# Patient Record
Sex: Male | Born: 1935 | Race: White | Hispanic: No | Marital: Married | Smoking: Former smoker
Health system: Southern US, Community
[De-identification: ages and names within clinical notes are randomized; demographics above are authoritative.]

## PROBLEM LIST (undated history)

## (undated) DIAGNOSIS — I2510 Atherosclerotic heart disease of native coronary artery without angina pectoris: Secondary | ICD-10-CM

## (undated) DIAGNOSIS — E785 Hyperlipidemia, unspecified: Secondary | ICD-10-CM

## (undated) DIAGNOSIS — Z950 Presence of cardiac pacemaker: Secondary | ICD-10-CM

## (undated) DIAGNOSIS — K219 Gastro-esophageal reflux disease without esophagitis: Secondary | ICD-10-CM

## (undated) DIAGNOSIS — Z87442 Personal history of urinary calculi: Secondary | ICD-10-CM

## (undated) DIAGNOSIS — I10 Essential (primary) hypertension: Secondary | ICD-10-CM

## (undated) DIAGNOSIS — M1990 Unspecified osteoarthritis, unspecified site: Secondary | ICD-10-CM

## (undated) DIAGNOSIS — I729 Aneurysm of unspecified site: Secondary | ICD-10-CM

## (undated) HISTORY — PX: CARDIAC CATHETERIZATION: SHX172

## (undated) HISTORY — PX: CORONARY ANGIOPLASTY: SHX604

## (undated) HISTORY — PX: TONSILLECTOMY: SUR1361

## (undated) HISTORY — PX: CARDIAC SURGERY: SHX584

## (undated) HISTORY — PX: CORONARY ARTERY BYPASS GRAFT: SHX141

## (undated) HISTORY — PX: APPENDECTOMY: SHX54

## (undated) HISTORY — PX: CHOLECYSTECTOMY: SHX55

## (undated) HISTORY — PX: MASTOIDECTOMY: SHX711

---

## 1993-12-07 HISTORY — PX: CORONARY ARTERY BYPASS GRAFT: SHX141

## 2010-08-20 ENCOUNTER — Inpatient Hospital Stay: Payer: Self-pay | Admitting: Internal Medicine

## 2011-08-17 DIAGNOSIS — H3411 Central retinal artery occlusion, right eye: Secondary | ICD-10-CM | POA: Insufficient documentation

## 2011-08-17 DIAGNOSIS — I2510 Atherosclerotic heart disease of native coronary artery without angina pectoris: Secondary | ICD-10-CM | POA: Insufficient documentation

## 2011-08-17 DIAGNOSIS — G4700 Insomnia, unspecified: Secondary | ICD-10-CM | POA: Insufficient documentation

## 2011-08-17 DIAGNOSIS — Z8679 Personal history of other diseases of the circulatory system: Secondary | ICD-10-CM | POA: Insufficient documentation

## 2011-08-17 DIAGNOSIS — E782 Mixed hyperlipidemia: Secondary | ICD-10-CM | POA: Insufficient documentation

## 2011-08-17 DIAGNOSIS — N200 Calculus of kidney: Secondary | ICD-10-CM | POA: Insufficient documentation

## 2011-08-17 DIAGNOSIS — I10 Essential (primary) hypertension: Secondary | ICD-10-CM | POA: Insufficient documentation

## 2012-03-26 ENCOUNTER — Emergency Department: Payer: Self-pay | Admitting: Emergency Medicine

## 2012-03-26 LAB — COMPREHENSIVE METABOLIC PANEL
Albumin: 3.9 g/dL (ref 3.4–5.0)
Alkaline Phosphatase: 102 U/L (ref 50–136)
BUN: 29 mg/dL — ABNORMAL HIGH (ref 7–18)
Bilirubin,Total: 0.5 mg/dL (ref 0.2–1.0)
Calcium, Total: 9 mg/dL (ref 8.5–10.1)
Co2: 23 mmol/L (ref 21–32)
EGFR (Non-African Amer.): 52 — ABNORMAL LOW
Potassium: 3.3 mmol/L — ABNORMAL LOW (ref 3.5–5.1)
SGOT(AST): 32 U/L (ref 15–37)
Sodium: 141 mmol/L (ref 136–145)
Total Protein: 7.2 g/dL (ref 6.4–8.2)

## 2012-03-26 LAB — CBC
HCT: 43.4 % (ref 40.0–52.0)
HGB: 14.5 g/dL (ref 13.0–18.0)
MCH: 30.6 pg (ref 26.0–34.0)
RBC: 4.75 10*6/uL (ref 4.40–5.90)

## 2012-03-26 LAB — LIPASE, BLOOD: Lipase: 120 U/L (ref 73–393)

## 2012-03-26 LAB — TROPONIN I: Troponin-I: <0.02 ng/mL

## 2012-03-28 ENCOUNTER — Emergency Department: Payer: Self-pay | Admitting: Emergency Medicine

## 2012-03-29 LAB — URINALYSIS, COMPLETE
Bilirubin,UR: NEGATIVE
Blood: NEGATIVE
Glucose,UR: NEGATIVE mg/dL (ref 0–75)
Nitrite: NEGATIVE
Ph: 5 (ref 4.5–8.0)
Squamous Epithelial: <1
WBC UR: 1 /HPF (ref 0–5)

## 2012-03-29 LAB — BASIC METABOLIC PANEL
Anion Gap: 9 (ref 7–16)
Calcium, Total: 9 mg/dL (ref 8.5–10.1)
Chloride: 107 mmol/L (ref 98–107)
Co2: 26 mmol/L (ref 21–32)
EGFR (African American): 45 — ABNORMAL LOW
Potassium: 4.2 mmol/L (ref 3.5–5.1)
Sodium: 142 mmol/L (ref 136–145)

## 2012-04-04 ENCOUNTER — Ambulatory Visit: Payer: Self-pay | Admitting: Vascular Surgery

## 2012-04-04 LAB — BASIC METABOLIC PANEL
Anion Gap: 6 — ABNORMAL LOW (ref 7–16)
BUN: 13 mg/dL (ref 7–18)
Calcium, Total: 8.8 mg/dL (ref 8.5–10.1)
Chloride: 107 mmol/L (ref 98–107)
EGFR (African American): >60
EGFR (Non-African Amer.): >60
Glucose: 114 mg/dL — ABNORMAL HIGH (ref 65–99)
Potassium: 4 mmol/L (ref 3.5–5.1)

## 2012-04-08 ENCOUNTER — Ambulatory Visit: Payer: Self-pay | Admitting: Vascular Surgery

## 2012-04-08 LAB — CBC
HCT: 42.3 % (ref 40.0–52.0)
MCH: 31.2 pg (ref 26.0–34.0)
MCHC: 33.7 g/dL (ref 32.0–36.0)
MCV: 92 fL (ref 80–100)
Platelet: 300 10*3/uL (ref 150–440)

## 2012-04-15 ENCOUNTER — Inpatient Hospital Stay: Payer: Self-pay | Admitting: Vascular Surgery

## 2012-04-16 LAB — PROTIME-INR: INR: 1

## 2012-04-16 LAB — CBC WITH DIFFERENTIAL/PLATELET
Basophil %: 0.7 %
Eosinophil %: 4.3 %
MCH: 31.1 pg (ref 26.0–34.0)
Monocyte #: 0.7 x10 3/mm (ref 0.2–1.0)
Neutrophil %: 75 %
WBC: 7.4 10*3/uL (ref 3.8–10.6)

## 2012-04-16 LAB — COMPREHENSIVE METABOLIC PANEL
Albumin: 3.1 g/dL — ABNORMAL LOW (ref 3.4–5.0)
Alkaline Phosphatase: 130 U/L (ref 50–136)
Anion Gap: 6 — ABNORMAL LOW (ref 7–16)
Bilirubin,Total: 0.8 mg/dL (ref 0.2–1.0)
Calcium, Total: 8.3 mg/dL — ABNORMAL LOW (ref 8.5–10.1)
Chloride: 103 mmol/L (ref 98–107)
Co2: 31 mmol/L (ref 21–32)
EGFR (African American): >60
Glucose: 127 mg/dL — ABNORMAL HIGH (ref 65–99)
Potassium: 5.2 mmol/L — ABNORMAL HIGH (ref 3.5–5.1)
SGPT (ALT): 347 U/L — ABNORMAL HIGH
Sodium: 140 mmol/L (ref 136–145)

## 2012-04-16 LAB — PHOSPHORUS: Phosphorus: 2.7 mg/dL (ref 2.5–4.9)

## 2012-04-16 LAB — MAGNESIUM: Magnesium: 1.6 mg/dL — ABNORMAL LOW

## 2012-04-16 LAB — APTT: Activated PTT: 35.9 secs (ref 23.6–35.9)

## 2012-08-30 ENCOUNTER — Observation Stay: Payer: Self-pay | Admitting: Internal Medicine

## 2012-08-30 LAB — BASIC METABOLIC PANEL
Anion Gap: 8 (ref 7–16)
BUN: 18 mg/dL (ref 7–18)
Chloride: 109 mmol/L — ABNORMAL HIGH (ref 98–107)
Co2: 26 mmol/L (ref 21–32)
Creatinine: 1.05 mg/dL (ref 0.60–1.30)
EGFR (African American): >60
Glucose: 120 mg/dL — ABNORMAL HIGH (ref 65–99)
Osmolality: 288 (ref 275–301)

## 2012-08-30 LAB — CBC
MCH: 32.1 pg (ref 26.0–34.0)
MCHC: 35.4 g/dL (ref 32.0–36.0)
Platelet: 193 10*3/uL (ref 150–440)
RDW: 13.5 % (ref 11.5–14.5)

## 2012-08-30 LAB — CK TOTAL AND CKMB (NOT AT ARMC): CK-MB: 1 ng/mL (ref 0.5–3.6)

## 2012-08-30 LAB — TROPONIN I: Troponin-I: <0.02 ng/mL

## 2012-08-31 LAB — LIPID PANEL
Cholesterol: 120 mg/dL (ref 0–200)
HDL Cholesterol: 24 mg/dL — ABNORMAL LOW (ref 40–60)
Ldl Cholesterol, Calc: 65 mg/dL (ref 0–100)
VLDL Cholesterol, Calc: 31 mg/dL (ref 5–40)

## 2012-08-31 LAB — CBC WITH DIFFERENTIAL/PLATELET
Basophil #: 0.1 10*3/uL (ref 0.0–0.1)
Basophil %: 1 %
Eosinophil #: 0.2 10*3/uL (ref 0.0–0.7)
HCT: 37.4 % — ABNORMAL LOW (ref 40.0–52.0)
Lymphocyte #: 2 10*3/uL (ref 1.0–3.6)
Lymphocyte %: 28.9 %
MCH: 32.8 pg (ref 26.0–34.0)
MCHC: 36.3 g/dL — ABNORMAL HIGH (ref 32.0–36.0)
Monocyte #: 0.6 x10 3/mm (ref 0.2–1.0)
Neutrophil #: 4.1 10*3/uL (ref 1.4–6.5)
RBC: 4.14 10*6/uL — ABNORMAL LOW (ref 4.40–5.90)
RDW: 13.8 % (ref 11.5–14.5)
WBC: 7 10*3/uL (ref 3.8–10.6)

## 2012-08-31 LAB — BASIC METABOLIC PANEL
Anion Gap: 10 (ref 7–16)
BUN: 18 mg/dL (ref 7–18)
Calcium, Total: 8.3 mg/dL — ABNORMAL LOW (ref 8.5–10.1)
Chloride: 110 mmol/L — ABNORMAL HIGH (ref 98–107)
Co2: 25 mmol/L (ref 21–32)
Creatinine: 1.02 mg/dL (ref 0.60–1.30)
EGFR (African American): >60

## 2012-08-31 LAB — TROPONIN I
Troponin-I: <0.02 ng/mL
Troponin-I: <0.02 ng/mL

## 2014-04-24 DIAGNOSIS — M5136 Other intervertebral disc degeneration, lumbar region: Secondary | ICD-10-CM | POA: Insufficient documentation

## 2014-04-24 DIAGNOSIS — M51369 Other intervertebral disc degeneration, lumbar region without mention of lumbar back pain or lower extremity pain: Secondary | ICD-10-CM | POA: Insufficient documentation

## 2015-03-07 DIAGNOSIS — I071 Rheumatic tricuspid insufficiency: Secondary | ICD-10-CM | POA: Insufficient documentation

## 2015-03-26 NOTE — H&P (Signed)
PATIENT NAME:  [name], [name] MR#:  [id number] DATE OF BIRTH:  November 12, 1933  DATE OF ADMISSION:  08/30/2012  PRIMARY CARE PHYSICIAN:  Dr. Kym Groom CARDIOLOGIST:  Dr. Nehemiah Massed  CHIEF COMPLAINT:  Chest pain.  HISTORY OF PRESENT ILLNESS: This is a 81 year old man with history of coronary artery disease, peripheral vascular disease, hypertension, hyperlipidemia, gastroesophageal reflux disease, and depression. He presents with chest pain. Yesterday while [place] he developed chest pain across the left side of his chest that developed into arm pain. He did have some sweating at the time. He was outside. He went to sit down in the air conditioning. The pain went away after 10 minutes. No associated nausea. No shortness of breath. Nothing made it better or worse except for time. Pain was not that bad, 5/10 in intensity. Today he also developed chest pain at rest while he was watching TV into his left shoulder. He took a nitroglycerin. The pain stopped for about five minutes and then came back. Then he took another nitroglycerin. The pain went away when he came here to the Emergency Room after 20 minutes. The pain today was described as a sharp pain across his chest and then a steady pain after that, as severe as 8 to 9 out of 10 in intensity, again up into the left shoulder. No shortness of breath. No nausea. No diaphoresis. Now he is chest pain free. He had a stress test that was back in 2012.   PAST MEDICAL HISTORY:  1. Coronary disease.  2. Peripheral vascular disease.  3. Hypertension.  4. Hyperlipidemia.  5. Gastroesophageal reflux disease.  6. Depression.   PAST SURGICAL HISTORY:  1. Coronary artery bypass grafting. 2. Cholecystectomy.  3. Appendectomy.  4. Aneurysm in the abdomen.   ALLERGIES: Penicillin.   MEDICATIONS:  1. Lotrel 5/40,1 tablet daily.  2. Aspirin 81 mg daily.  3. Celexa 20 mg daily.  4. Gabapentin 100 mg twice a day.  5. Prevacid 30 mg daily.   6. Lescol 20 mg at bedtime.  7. Metoprolol ER 25 mg in the morning.   SOCIAL HISTORY: Quit smoking at age 27. Does drink a glass or two of wine per week. No drug use.  He was a  businessman in the past, worked with Sales executive.   FAMILY HISTORY: Mother died of complications after a hip fracture. Father died at 35 of a myocardial infarction.   REVIEW OF SYSTEMS: No fever or chills.  Positive for sweating with chest pain yesterday. Positive for weight gain, no fatigue. EYES: He lost vision in his right eye. EARS, NOSE, MOUTH, AND THROAT: No hearing loss. No sore throat. No difficulty swallowing. CARDIOVASCULAR: Positive for chest pain. No palpitations. RESPIRATORY: No shortness of breath. Positive for coughing for one month. Slight yellow phlegm. No cough no hemoptysis.  GASTROINTESTINAL: No nausea. No vomiting. No abdominal pain. No diarrhea. No constipation. No bright red blood per rectum. No melena. GENITOURINARY: No burning on urination or hematuria. MUSCULOSKELETAL: Positive for right hip pain. INTEGUMENT: No rashes or eruptions. NEUROLOGIC: No fainting or blackouts. PSYCHIATRIC: On medication for depression. ENDOCRINE: No thyroid problems. HEMATOLOGIC/LYMPHATIC: No anemia.   PHYSICAL EXAMINATION:  VITAL SIGNS: Temperature 97,  pulse 67, respirations 18, blood pressure 133/93, pulse oximetry 98% on room air.   GENERAL: No respiratory distress.   EYES: Conjunctivae and lids normal. Pupils equal, round, and reactive to light. Extraocular muscles intact. No nystagmus.  EARS, NOSE, MOUTH, AND THROAT: Tympanic membranes no erythema.  Nasal mucosa no erythema. Throat no erythema. No exudate seen. Lips and gums no lesions.   NECK: No JVD. No bruits. No lymphadenopathy. No thyromegaly. No thyroid nodules palpated.   LUNGS: Lungs are clear to auscultation. Decreased breath sounds bilaterally. No rhonchi, rales, or wheeze heard.   CARDIOVASCULAR: S1, S2 normal. No gallops, rubs, or murmurs heard.  Carotid upstroke 2+ bilaterally. No bruits.   EXTREMITIES: Dorsalis pedis pulses 1+ bilaterally. Trace edema to lower extremities. Positive chest wall pain to palpation of the left lower chest.  ABDOMEN: Soft, nontender. No organomegaly/splenomegaly. Normoactive bowel sounds. No masses felt.   LYMPHATIC: No lymph nodes in the neck.   MUSCULOSKELETAL: No clubbing, edema, or cyanosis. Left shoulder- Some pain to palpation behind the  posterior left shoulder.    INTEGUMENT: No rashes or eruptions.   NEUROLOGIC: Cranial nerves II through XII grossly intact. Deep tendon reflexes 2+ bilateral lower extremities.   PSYCHIATRIC: The patient is oriented to person, place, and time.   LABORATORY AND RADIOLOGICAL DATA: Chest x-ray showed no acute cardiopulmonary disease. White blood cell count 6.8, hemoglobin and hematocrit 14.5 and 40.9, platelet count 193, glucose 120, BUN 18, creatinine 1.05, sodium 143, potassium 3.8, chloride 109, CO2 26, calcium 9. Troponin negative. EKG showed normal sinus rhythm, 65 beats per minute, PR  interval slightly short at 84 ms.   ASSESSMENT AND PLAN:  1. Chest pain with history of coronary artery disease. Could also be musculoskeletal pain. With the patient's history of coronary artery disease I will admit as an observation. Get serial cardiac enzymes and put on telemetry. We will get a stress test if enzymes are negative. Continue aspirin and metoprolol. We will give Lovenox 1 mg/kg subcutaneous q.12 hours.  2. Peripheral vascular disease status post aneurysm repair. Continue aspirin.  3. Hypertension. Blood pressure controlled. Continue Lotrel and metoprolol.  4. Hyperlipidemia. Check a lipid profile in the a.m. Continue Lescol.  5. Gastroesophageal reflux disease. We do not have Prevacid here. We will give Protonix.  6. Depression. Continue Celexa. 7. CODE STATUS: The patient is a DO NOT RESUSCITATE.  TIME SPENT ON ADMISSION: 55 minutes.     ____________________________ Tana Conch. Leslye Peer, MD rjw:bjt D: 08/30/2012 20:53:25 ET T: 08/31/2012 07:46:51 ET JOB#: [id number]  cc: Tana Conch. Leslye Peer, MD, <Dictator> Corey Skains, MD Valera Castle, MD Marisue Brooklyn MD ELECTRONICALLY SIGNED 09/06/2012 21:22

## 2015-03-26 NOTE — Consult Note (Signed)
PATIENT NAME:  [name], [name] MR#:  [id number] DATE OF BIRTH:  August 29, 1933  DATE OF CONSULTATION:  08/31/2012  REFERRING PHYSICIAN:   CONSULTING PHYSICIAN:  Corey Skains, MD  REASON FOR CONSULTATION: Acute unstable angina with coronary disease and hypertension.   CHIEF COMPLAINT: "I have chest pain".  HISTORY OF PRESENT ILLNESS: This is a 81 year old male with known coronary artery disease status post previous coronary artery bypass graft, hypertension, and hyperlipidemia having new onset of diaphoresis, mild chest pain, shortness of breath without current evidence of myocardial infarction with an EKG showing normal sinus rhythm, normal EKG. The patient has had normal troponin, CK-MB. His symptoms are consistent with true angina. He has had hypertension and coronary artery disease in the past on appropriate medication management.   Remainder of review of systems negative for vision change, ringing in the ears, hearing loss, cough, congestion, heartburn, nausea, vomiting, diarrhea, bloody stools, stomach pain, extremity pain, leg weakness, cramping of the buttocks, known blood clots, headaches, blackouts, dizzy spells, nosebleed, congestion, trouble swallowing, frequent urination, urination at night, muscle weakness, numbness, anxiety, depression, skin lesions, skin rashes.   PAST MEDICAL HISTORY:  1. Coronary artery disease.  2. Hypertension.  3. Hyperlipidemia.    FAMILY HISTORY: No family history of cardiovascular disease.   SOCIAL HISTORY: Currently denies alcohol or tobacco use.   ALLERGIES: He has no known drug allergies.   CURRENT MEDICATIONS: As listed.    PHYSICAL EXAMINATION:   VITAL SIGNS: Blood pressure 136/68 bilaterally, heart rate 72 upright, reclining, and regular.   GENERAL: He is a well appearing male in no acute distress.   HEENT: No icterus, thyromegaly, ulcers, hemorrhage, or xanthelasma.   CARDIOVASCULAR: Regular rate and rhythm. Normal S1 and S2 without  murmur, gallop, or rub. PMI is normal size and placement. Carotid upstroke normal without bruit. Jugular pressure is normal.   LUNGS: Lungs are clear to auscultation with normal respirations.   ABDOMEN: Soft, nontender without hepatosplenomegaly or masses. Abdominal aorta is normal size without bruit.   EXTREMITIES: 2+ bilateral pulses in dorsal, pedal, radial, and femoral arteries without lower extremity edema, cyanosis, clubbing, or ulcers.   NEUROLOGIC: He is oriented to time, place, and person with normal mood and affect.   ASSESSMENT: This is a 81 year old male with known coronary artery disease status post coronary artery bypass graft with unstable angina needing further treatment options.  RECOMMENDATIONS: Proceed to cardiac catheterization to assess coronary anatomy and further treatment thereof as necessary. The patient understands the risks and benefits of cardiac catheterization. These include the possibility of death, stroke, heart attack, infection, bleeding, or blood clot. He is at low risk for conscious sedation.   ____________________________ Corey Skains, MD bjk:drc D: 09/02/2012 18:07:04 ET T: 09/03/2012 09:51:58 ET JOB#: [id number]  cc: Corey Skains, MD, <Dictator> Corey Skains MD ELECTRONICALLY SIGNED 09/07/2012 8:46

## 2015-03-26 NOTE — Discharge Summary (Signed)
PATIENT NAME:  [name], [name] MR#:  [id number] DATE OF BIRTH:  [birth year]  DATE OF ADMISSION:  08/30/2012 DATE OF DISCHARGE:  09/01/2012  DISCHARGE DIAGNOSES:  1. Chest pain secondary to unstable angina.  2. History of abdominal aortic aneurysm repair.  3. Depression.   PRIMARY CARE PHYSICIAN: Dr. Kym Groom   DISCHARGE MEDICATIONS: 1. Lotrel 5/40, 1 tablet daily.  2. Aspirin 81 mg daily.  3. Plavix 75 mg daily. 4. Celexa 20 mg daily. 5. Gabapentin 100 mg p.o. b.i.d.  6. Prevacid 30 mg daily. 7. Lescol 20 mg at bedtime.  8. Metoprolol ER 25 mg in the morning. 9. Imdur 30 mg daily.   NEW MEDICATIONS: Imdur and Plavix.   HOSPITAL COURSE: 81 year old male with history of coronary artery disease status post CABG and also three stents, also history of peripheral vascular disease, hypertension, hyperlipidemia, gastroesophageal reflux disease, depression came in because of chest pain which is relieved with nitroglycerin. Patient's chest pain was radiating to left shoulder. Patient is admitted for chest pain evaluation and telemetry. Troponins have been negative. EKG showed normal sinus, no ST-T changes. Patient started on aspirin, beta blockers and Lovenox full dose. Cardiology consult obtained with Dr. Nehemiah Massed. Patient was taken to cardiac catheterization yesterday and the cardiac catheterization showedrmal lv function patent stent of rca and lcx arteries patent lima to lad graft significant stenosis of graft to diagonal with complicated lesion high risk for pci and perfusion to very small artery   and the high risk of complication with closure with further intervention that is why patient did not have any intervention and advised medical management. Patient seen by Dr. Nehemiah Massed and he recommended to start Plavix and Imdur. He saw the patient this morning and I have seen the patient also. Patient denied any chest pain and was ambulating around the nurses station. Patient's cardiac catheterization did  have some oozing last night but it stopped now. Patient will be discharged home and follow up with Dr. Nehemiah Massed again. Patient's other medication for hyperlipidemia, gastroesophageal reflux disease and depression are continued with no changes.   LABORATORY, DIAGNOSTIC AND RADIOLOGICAL DATA: Patient's lab data showed creatinine 1.02, BUN 18, hemoglobin 13.6, hematocrit 37.4 yesterday. Troponins have been negative. Chest x-ray is clear.   PHYSICAL EXAMINATION: Blood pressure this morning 110/63, pulse 68, respirations 18, temperature 97.7, sats 98% on room air.   DISPOSITION: He will be discharged home and he will follow with Dr. Nehemiah Massed as scheduled.   ____________________________ Epifanio Lesches, MD sk:cms D: 09/01/2012 09:09:51 ET T: 09/01/2012 14:34:32 ET JOB#: [id number]  cc: Epifanio Lesches, MD, <Dictator> Valera Castle, MD  Epifanio Lesches MD ELECTRONICALLY SIGNED 09/11/2012 14:59

## 2015-03-29 DIAGNOSIS — M10071 Idiopathic gout, right ankle and foot: Secondary | ICD-10-CM | POA: Insufficient documentation

## 2015-03-31 NOTE — Op Note (Signed)
PATIENT NAME:  [name], [name] MR#:  [id number] DATE OF BIRTH:  [birth year]  DATE OF PROCEDURE:  04/04/2012  PREOPERATIVE DIAGNOSES:  1. Abdominal aortic aneurysm and iliac artery aneurysm.  2. Coronary disease.  3. Hypertension.   POSTOPERATIVE DIAGNOSES:  1. Abdominal aortic aneurysm and iliac artery aneurysm. 2. Coronary disease. 3. Hypertension.  PROCEDURES: 1. Catheter placement into second order right hypogastric artery branch from left femoral approach.  2. Aortogram and iliofemoral arteriogram.  3. Coil embolization of right hypogastric artery with three 8 x 14 coils and one 10 x 14 coil.  4. StarClose closure device, left femoral artery.   SURGEON: Algernon Huxley, MD   ANESTHESIA: Local with moderate conscious sedation.   ESTIMATED BLOOD LOSS: Minimal.   FLUOROSCOPY TIME: 4.3 minutes.   CONTRAST USED: 65 mL.   INDICATION FOR PROCEDURE: This is a gentleman who was referred to me for enlargement of his abdominal aortic aneurysm. This had been previously measured in the 4.6 to 4.8 range, however, on a recent uninfused CT scan performed due to mild chronic kidney disease and the presence of nephrolithiasis this was found to have increased to 5.2 cm in maximal diameter. He had previously had an infused scan which showed him to have the above-mentioned abdominal aortic aneurysm as well as a right common iliac artery aneurysm distally and the common iliac artery aneurysm was seen on the uninfused scan as well. The patient is brought in today for angiography for further delineation evaluation of his aneurysm and determine if he is a stent graft candidate as well as treatment of the right hypogastric artery with coil embolization if he is a stent graft candidate preliminary to that to be able to treat this in an endovascular fashion. Risks and benefits were discussed. Informed consent was obtained.   DESCRIPTION OF PROCEDURE: The patient is brought to the Vascular Interventional Radiology  Suite. Groins were shaved and prepped and a sterile surgical field was created. The left femoral head was localized with fluoroscopy and the left femoral artery was accessed without difficulty with a Seldinger needle. A 3-J wire and 5 French sheath were placed. Pigtail catheter was placed in the aorta at the L1 level and then pulled down to the aortic bifurcation for pelvic obliques. This demonstrated an infrarenal abdominal aortic aneurysm. There did appear to be adequate length of normal artery prior to the aneurysm starting which was noted on previous CT scans. The right common iliac artery aneurysm had a reasonably normal size flow lumen and this was mostly mural thrombus which again was seen on his infused CT scan from two years ago. The right hypogastric artery had a mild stenosis at its origin. The left hypogastric artery was patent. There was also a mild to moderate stenosis in the proximal right common iliac artery. The patient was given 2500 units of intravenous heparin for systemic anticoagulation. A rim catheter and a stiff-angled Glidewire were used to cannulate the right common iliac artery and selective angiogram was performed in this location with a steep LAO projection to splay open the origin of the hypogastric artery. Following this, I was able to easily cannulate the right hypogastric artery and get out into a second order branch of the hypogastric artery confirming intraluminal flow with contrast. I started coil embolization from this point back into the main hypogastric artery. A total of three 8 mm x 14 cm length Nester coils were placed and then a fourth coil which was 10 mm in diameter and 14  cm in length was placed back into the main hypogastric artery. The catheter were was pulled back to the origin of the hypogastric artery. Imaging was performed which showed good location of these coils with patency of the common and external iliac arteries and at this point I elected to terminate the  procedure. The diagnostic catheter was removed. Oblique arteriogram was performed of the left femoral artery and StarClose closure device was deployed in the usual fashion with excellent hemostatic result. The patient tolerated the procedure well and was taken to the recovery room in stable condition.   ____________________________ Algernon Huxley, MD jsd:drc D: 04/04/2012 10:54:03 ET T: 04/04/2012 11:10:45 ET JOB#: [id number]  cc: Algernon Huxley, MD, <Dictator>, Valera Castle, MD Algernon Huxley MD ELECTRONICALLY SIGNED 04/04/2012 16:38

## 2015-03-31 NOTE — Discharge Summary (Signed)
PATIENT NAME:  [name], EGNOR MR#:  [id number] DATE OF BIRTH:  10/23/1933  DATE OF ADMISSION:  04/15/2012 DATE OF DISCHARGE:  04/16/2012  ADMITTING/DISCHARGE DIAGNOSES:  1. Abdominal aortic aneurysm.  2. Hypertension.  3. Coronary artery disease.   PROCEDURES PERFORMED: Endovascular abdominal aortic aneurysm repair. For full details of that, please see that dictated Operative Summary.   BRIEF HISTORY: The patient is a 81 year old white male who has an abdominal aortic aneurysm with iliac aneurysmal component. He has undergone previous coil embolization of the right hypogastric artery and is brought back for his definitive aneurysm repair.   HOSPITAL COURSE: The patient was admitted to the hospital to Same Day Surgery and taken to the Operating Room where his aneurysm was repaired. He was admitted to the Critical Care Unit overnight for observation where he did well. He was able to advance his diet without difficulty. His pain was well-controlled with oral medications. He was afebrile with vital signs stable. He progressed overnight and his morning labs were all reasonable. He had no renal insufficiency or significant anemia and he was deemed stable for discharge on postoperative day number one.    DISPOSITION: The patient will be discharged home accompanied by family.   DIET AND ACTIVITY: His diet will be regular. His activity will be as tolerated.   FOLLOW-UP: His return appointment will be in 4 to 6 weeks in the office in the aortic duplex.   DISCHARGE MEDICATIONS: He will resume all of his previous home medications and was given Norco as needed for pain. ____________________________ Algernon Huxley, MD jsd:rbg D:  04/16/2012 07:18:02 ET  T:  04/18/2012 12:53:53 ET  JOB#: [id number] Erskine Squibb DEW MD ELECTRONICALLY SIGNED 04/20/2012 15:11

## 2015-03-31 NOTE — Op Note (Signed)
PATIENT NAME:  [name], BURNSIDE MR#:  [id number] DATE OF BIRTH:  05/18/1933  DATE OF PROCEDURE:  04/15/2012  PREOPERATIVE DIAGNOSIS: Abdominal aortic aneurysm.   POSTOPERATIVE DIAGNOSIS: Abdominal aortic aneurysm.  PROCEDURES PERFORMED:  1. Abdominal aortic aneurysm repair with endovascular stent graft, a Gore Excluder.  2. Extension of right iliac limb.  3. Bilateral open femoral artery cutdowns, Dr. Lucky Cowboy on the right and myself on the left.  4. Introduction catheter into aorta, bilateral.   SURGEON: Leotis Pain, MD   CO-SURGEON: Katha Cabal, MD    ANESTHESIA: General by endotracheal intubation.    FLUIDS: Per anesthesia record.   ESTIMATED BLOOD LOSS: Less than 50 mL.   SPECIMEN: None.   INDICATIONS: [name] is a 81 year old gentleman who presented to the office with abdominal aortic aneurysm identified on noncontrasted CT for exclusion of a renal stone. The risks and benefits as well as alternative therapies were discussed in detail. After review of the scan, the patient has subsequently been prepared for endovascular repair. He agrees to proceed.   DESCRIPTION OF PROCEDURE: The patient is taken to Special Procedures, placed in supine position. After adequate general anesthesia is induced and appropriate invasive monitors are placed, he is positioned supine. He is prepped from the nipples to the knees.   A vertical incision is created in both groins simultaneously overlying the femoral impulses, Dr. Lucky Cowboy working on the right and myself on the left. Dissection is carried down to expose the femoral artery, which is looped proximally and distally with Silastic vessel loops, and 6000 units of heparin is given.   Seldinger needles are then used to access the common femoral arteries through these open incisions, and J-wires are advanced into the abdominal aorta. The catheter is then advanced, a pigtail catheter on the left and KMP on the right. Bolus injection of contrast is utilized to  demonstrate the aorta. The renal arteries are localized, as is the bifurcation and the left external iliac and internal iliac bifurcation. The right internal iliac has been coiled preoperatively.   After appropriate measurements are made, the pigtail catheter is used to advance an Amplatz Superstiff Wire up the left. An 18-French dry seal sheath is advanced up over the wire and exchanged for the 8-French sheath that had been inserted. The main body, a 23 x 12 x 14 Gore Excluder device, was then advanced up to the level of the renals and deployed without difficulty.   Working simultaneously, Dr. Lucky Cowboy then cannulated the gate with an angled Glidewire, a KMP catheter advanced up and exchanged for a pigtail catheter which is rotated within the main body confirming intraluminal placement. The pigtail was then positioned just above the graft, and bolus injection of contrast was utilized to demonstrate appropriate positioning of the stent graft below the lower left renal. A 12-French sheath is then exchanged for the 8-French  sheath on the right, and a 12 x 14 contralateral limb is then advanced and deployed without difficulty. Follow-up angiography demonstrated that the limb was only seated approximately 1 cm into the external; and this would be inadequate to maintain an adequate seal, therefore, a 10 x 7 extension was added. A Coda balloon was then used to angioplasty all seal zones as well as the overlap of the contralateral limb. The pigtail catheter was then advanced up the right side. Bolus injection of contrast was used with delayed images. No endoleaks were noted.   Both sheaths were then removed. Wires were removed, and the arteriotomies were  repaired with interrupted 6-0 Prolene. Both wounds were then irrigated and closed in multiple layers using 2-0 Vicryl for the femoral sheath, multiple layers of 3-0 Vicryl, and then 4-0 Monocryl for the skin. Dermabond was applied. The patient tolerated the procedure  well, and there were no immediate complications. Sponge and needle counts were correct. He is taken to the recovery area in excellent condition.  ____________________________ Katha Cabal, MD ggs:cbb D: 04/15/2012 09:54:42 ET T: 04/15/2012 12:11:20 ET JOB#: [id number] cc: Katha Cabal, MD, <Dictator> Katha Cabal MD ELECTRONICALLY SIGNED 04/25/2012 17:40

## 2015-03-31 NOTE — Op Note (Signed)
PATIENT NAME:  [name], [name] MR#:  [id number] DATE OF BIRTH:  Mar 16, 1933  DATE OF PROCEDURE:  04/15/2012  PREOPERATIVE DIAGNOSES:  1. Abdominal aortic aneurysm.  2. Hypertension.  3. History of coronary artery disease.   POSTOPERATIVE DIAGNOSES:  1. Abdominal aortic aneurysm.  2. Hypertension.  3. History of coronary artery disease.   PROCEDURES:    1. Bilateral femoral artery cutdowns for placement of endoprosthesis, right by Dr. Lucky Cowboy, left by Dr. Delana Meyer.  2. Catheter placement into aorta from bilateral femoral approaches, right by Dr. Lucky Cowboy, left by Dr. Delana Meyer.  3. Placement of a Gore Excluder C3 endoprosthesis, main body 23, 14 cm length, primary right, co-surgeons, Dr. Lucky Cowboy and Dr. Delana Meyer.  4. Placement of a contralateral limb 12 mm diameter 14 cm length, co-surgeons, Dr. Lucky Cowboy and Dr. Delana Meyer.  5. Placement of a primary left iliac extender with a 10 mm diameter x 7 cm length. 6. Placement of a right iliac extender 10 mm diameter x 7 cm. length by Dr. Lucky Cowboy.    CO-SURGEONS: Leotis Pain, MD and Hortencia Pilar, MD  ANESTHESIA: General.   ESTIMATED BLOOD LOSS: Approximately 50 mL.  FLUOROSCOPY TIME: 8.9 minutes.  CONTRAST USED: 55 mL.   INDICATION FOR PROCEDURE: This is a 81 year old male with an abdominal aortic aneurysm greater than 5 cm in maximal diameter. He had acceptable anatomy for endoprosthesis placement and he desired endovascular repair. Risks and benefits are discussed. Informed consent was obtained.   DESCRIPTION OF PROCEDURE: The patient was brought to the vascular interventional radiology suite and anesthesia caused for general anesthetic. His groins and abdomen were sterilely prepped and draped and a sterile surgical field was created. We created femoral artery cutdowns bilaterally. I performed the right and Dr. Delana Meyer performed the left and the femoral arteries were dissected out and encircled with vessel loops. The patient was given 6,000 units of intravenous heparin for  systemic anticoagulation. An 8 French sheath was placed bilaterally. Dr. Delana Meyer placed the pigtail catheter up the left hand side, and an AP aortogram was performed. He then placed an 54 French sheath in the main body which was 23 mm diameter proximal, 14 cm length Gore Excluder C3 endoprosthesis. I placed a Kumpe catheter from the right femoral sheath into the aorta. He deployed this and I cannulated the contralateral gate and exchanged for a pigtail catheter and confirmed successful cannulation. Injection performed which showed an excellent location just below the left renal artery which was lowest. He completed the deployment of the main body and used the Coda balloon to tap the seal zones. I performed a retrograde arteriogram and placed a 12 mm diameter x14 centimeter length contralateral limb. This came just to the proximal external iliac artery. 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 length extender was placed another 3 to 4 cm distally into the external iliac artery. The compliant balloon was used to iron out the junction points and seal zones and placed a pigtail catheter through the left femoral sheath into the aorta and performed occlusion aortogram. This showed excellent location of the endoprosthesis just below the renal vessels with the left hypogastric artery being patent. The right hypogastric artery being previously coiled and no significant endoleak seen. At this point, the sheath was removed. The femoral arteriotomies were closed with interrupted Prolene sutures. The groins were then closed with  layered 2-0 Vicryl, 3-0 Vicryl, another 3-0 Vicryl and a 4-0 Monocryl. Dermabond was placed as a dressing. The patient tolerated the procedure well and was taken to the recovery room in stable condition.   ____________________________ Algernon Huxley,  MD jsd:ap D: 04/15/2012 09:52:47 ET T: 04/15/2012 12:06:13 ET JOB#: [id number]  cc: Algernon Huxley, MD, <Dictator> [place], MD Algernon Huxley MD ELECTRONICALLY SIGNED 04/20/2012 15:11

## 2015-08-08 DIAGNOSIS — M461 Sacroiliitis, not elsewhere classified: Secondary | ICD-10-CM | POA: Insufficient documentation

## 2015-10-23 ENCOUNTER — Other Ambulatory Visit: Payer: Self-pay | Admitting: Otolaryngology

## 2015-10-23 DIAGNOSIS — H9201 Otalgia, right ear: Secondary | ICD-10-CM

## 2015-10-23 DIAGNOSIS — R42 Dizziness and giddiness: Secondary | ICD-10-CM

## 2015-10-28 ENCOUNTER — Ambulatory Visit
Admission: RE | Admit: 2015-10-28 | Discharge: 2015-10-28 | Disposition: A | Discharge status: Home / Self Care | Payer: Medicare Other | Source: Ambulatory Visit | Attending: Otolaryngology | Admitting: Otolaryngology

## 2015-10-28 DIAGNOSIS — H9201 Otalgia, right ear: Secondary | ICD-10-CM | POA: Insufficient documentation

## 2015-10-28 DIAGNOSIS — I639 Cerebral infarction, unspecified: Secondary | ICD-10-CM | POA: Insufficient documentation

## 2015-10-28 DIAGNOSIS — R42 Dizziness and giddiness: Secondary | ICD-10-CM | POA: Diagnosis not present

## 2015-10-28 MED ORDER — GADOBENATE DIMEGLUMINE 529 MG/ML IV SOLN
20.0000 mL | Freq: Once | INTRAVENOUS | Status: AC | PRN
Start: 2015-10-28 — End: 2015-10-28
  Administered 2015-10-28: 19 mL via INTRAVENOUS

## 2015-12-10 ENCOUNTER — Encounter: Payer: Self-pay | Admitting: Emergency Medicine

## 2015-12-10 ENCOUNTER — Other Ambulatory Visit: Payer: Self-pay

## 2015-12-10 ENCOUNTER — Emergency Department: Payer: Medicare Other

## 2015-12-10 ENCOUNTER — Emergency Department
Admission: EM | Admit: 2015-12-10 | Discharge: 2015-12-10 | Disposition: A | Discharge status: Home / Self Care | Payer: Medicare Other | Attending: Emergency Medicine | Admitting: Emergency Medicine

## 2015-12-10 DIAGNOSIS — R110 Nausea: Secondary | ICD-10-CM | POA: Insufficient documentation

## 2015-12-10 DIAGNOSIS — R079 Chest pain, unspecified: Secondary | ICD-10-CM | POA: Diagnosis present

## 2015-12-10 DIAGNOSIS — I10 Essential (primary) hypertension: Secondary | ICD-10-CM | POA: Diagnosis not present

## 2015-12-10 DIAGNOSIS — Z880 Allergy status to penicillin: Secondary | ICD-10-CM | POA: Insufficient documentation

## 2015-12-10 DIAGNOSIS — Z87891 Personal history of nicotine dependence: Secondary | ICD-10-CM | POA: Insufficient documentation

## 2015-12-10 DIAGNOSIS — I4891 Unspecified atrial fibrillation: Secondary | ICD-10-CM | POA: Insufficient documentation

## 2015-12-10 DIAGNOSIS — R0789 Other chest pain: Secondary | ICD-10-CM

## 2015-12-10 DIAGNOSIS — R001 Bradycardia, unspecified: Secondary | ICD-10-CM

## 2015-12-10 HISTORY — DX: Essential (primary) hypertension: I10

## 2015-12-10 HISTORY — DX: Aneurysm of unspecified site: I72.9

## 2015-12-10 LAB — BASIC METABOLIC PANEL
Anion gap: 10 (ref 5–15)
BUN: 14 mg/dL (ref 6–20)
CHLORIDE: 104 mmol/L (ref 101–111)
CO2: 25 mmol/L (ref 22–32)
CREATININE: 0.96 mg/dL (ref 0.61–1.24)
Calcium: 9.7 mg/dL (ref 8.9–10.3)
GFR calc Af Amer: >60 mL/min (ref >60)
GFR calc non Af Amer: >60 mL/min (ref >60)
GLUCOSE: 166 mg/dL — AB (ref 65–99)
Potassium: 3.8 mmol/L (ref 3.5–5.1)
SODIUM: 139 mmol/L (ref 135–145)

## 2015-12-10 LAB — HEPATIC FUNCTION PANEL
ALK PHOS: 73 U/L (ref 38–126)
ALT: 20 U/L (ref 17–63)
AST: 23 U/L (ref 15–41)
Albumin: 4.5 g/dL (ref 3.5–5.0)
BILIRUBIN DIRECT: 0.3 mg/dL (ref 0.1–0.5)
BILIRUBIN INDIRECT: 1 mg/dL — AB (ref 0.3–0.9)
BILIRUBIN TOTAL: 1.3 mg/dL — AB (ref 0.3–1.2)
Total Protein: 7.2 g/dL (ref 6.5–8.1)

## 2015-12-10 LAB — CBC
HCT: 46.2 % (ref 40.0–52.0)
Hemoglobin: 16 g/dL (ref 13.0–18.0)
MCH: 32.4 pg (ref 26.0–34.0)
MCHC: 34.6 g/dL (ref 32.0–36.0)
MCV: 93.5 fL (ref 80.0–100.0)
PLATELETS: 168 10*3/uL (ref 150–440)
RBC: 4.94 MIL/uL (ref 4.40–5.90)
RDW: 13.9 % (ref 11.5–14.5)
WBC: 7.2 10*3/uL (ref 3.8–10.6)

## 2015-12-10 LAB — TROPONIN I
Troponin I: <0.03 ng/mL (ref <0.031)
Troponin I: <0.03 ng/mL (ref <0.031)

## 2015-12-10 LAB — PROTIME-INR
INR: 1.14
PROTHROMBIN TIME: 14.8 s (ref 11.4–15.0)

## 2015-12-10 LAB — LIPASE, BLOOD: LIPASE: 19 U/L (ref 11–51)

## 2015-12-10 MED ORDER — HYDROCODONE-ACETAMINOPHEN 5-325 MG PO TABS: 1.0000 | ORAL_TABLET | ORAL | Status: DC | PRN

## 2015-12-10 MED ORDER — ONDANSETRON HCL 4 MG/2ML IJ SOLN
4.0000 mg | Freq: Once | INTRAMUSCULAR | Status: AC
  Administered 2015-12-10: 4 mg via INTRAVENOUS
  Filled 2015-12-10: qty 2

## 2015-12-10 MED ORDER — MORPHINE SULFATE (PF) 4 MG/ML IV SOLN
4.0000 mg | Freq: Once | INTRAVENOUS | Status: AC
  Administered 2015-12-10: 4 mg via INTRAVENOUS
  Filled 2015-12-10: qty 1

## 2015-12-10 NOTE — Discharge Instructions (Signed)
At this time we are very reassured by our findings. You would prefer to go home which is not unreasonable. We have arranged you to see her doctor tomorrow morning, Dr. Nehemiah Massed we'll see what 10:00. If in the meantime your shortness of breath nausea lightheadedness or you feel worse in any way, please return to the emergency Department  Chest Wall Pain Chest wall pain is pain in or around the bones and muscles of your chest. Sometimes, an injury causes this pain. Sometimes, the cause may not be known. This pain may take several weeks or longer to get better. HOME CARE Pay attention to any changes in your symptoms. Take these actions to help with your pain:  Rest as told by your doctor.  Avoid activities that cause pain. Try not to use your chest, belly (abdominal), or side muscles to lift heavy things.  If directed, apply ice to the painful area:  Put ice in a plastic bag.  Place a towel between your skin and the bag.  Leave the ice on for 20 minutes, 2-3 times per day.  Take over-the-counter and prescription medicines only as told by your doctor.  Do not use tobacco products, including cigarettes, chewing tobacco, and e-cigarettes. If you need help quitting, ask your doctor.  Keep all follow-up visits as told by your doctor. This is important. GET HELP IF:  You have a fever.  Your chest pain gets worse.  You have new symptoms. GET HELP RIGHT AWAY IF:  You feel sick to your stomach (nauseous) or you throw up (vomit).  You feel sweaty or light-headed.  You have a cough with phlegm (sputum) or you cough up blood.  You are short of breath.   This information is not intended to replace advice given to you by your health care provider. Make sure you discuss any questions you have with your health care provider.   Document Released: 05/11/2008 Document Revised: 08/14/2015 Document Reviewed: 02/18/2015 Elsevier Interactive Patient Education 2016 [employer].  Chest Pain  Observation It is often hard to give a specific diagnosis for the cause of chest pain. Among other possibilities your symptoms might be caused by inadequate oxygen delivery to your heart (angina). Angina that is not treated or evaluated can lead to a heart attack (myocardial infarction) or death. Blood tests, electrocardiograms, and X-rays may have been done to help determine a possible cause of your chest pain. After evaluation and observation, your health care provider has determined that it is unlikely your pain was caused by an unstable condition that requires hospitalization. However, a full evaluation of your pain may need to be completed, with additional diagnostic testing as directed. It is very important to keep your follow-up appointments. Not keeping your follow-up appointments could result in permanent heart damage, disability, or death. If there is any problem keeping your follow-up appointments, you must call your health care provider. HOME CARE INSTRUCTIONS  Due to the slight chance that your pain could be angina, it is important to follow your health care provider's treatment plan and also maintain a healthy lifestyle:  Maintain or work toward achieving a healthy weight.  Stay physically active and exercise regularly.  Decrease your salt intake.  Eat a balanced, healthy diet. Talk to a dietitian to learn about heart-healthy foods.  Increase your fiber intake by including whole grains, vegetables, fruits, and nuts in your diet.  Avoid situations that cause stress, anger, or depression.  Take medicines as advised by your health care provider. Report  any side effects to your health care provider. Do not stop medicines or adjust the dosages on your own.  Quit smoking. Do not use nicotine patches or gum until you check with your health care provider.  Keep your blood pressure, blood sugar, and cholesterol levels within normal limits.  Limit alcohol intake to no more than 1 drink  per day for women who are not pregnant and 2 drinks per day for men.  Do not abuse drugs. SEEK IMMEDIATE MEDICAL CARE IF: You have severe chest pain or pressure which may include symptoms such as:  You feel pain or pressure in your arms, neck, jaw, or back.  You have severe back or abdominal pain, feel sick to your stomach (nauseous), or throw up (vomit).  You are sweating profusely.  You are having a fast or irregular heartbeat.  You feel short of breath while at rest.  You notice increasing shortness of breath during rest, sleep, or with activity.  You have chest pain that does not get better after rest or after taking your usual medicine.  You wake from sleep with chest pain.  You are unable to sleep because you cannot breathe.  You develop a frequent cough or you are coughing up blood.  You feel dizzy, faint, or experience extreme fatigue.  You develop severe weakness, dizziness, fainting, or chills. Any of these symptoms may represent a serious problem that is an emergency. Do not wait to see if the symptoms will go away. Call your local emergency services (911 in the [place] Do not drive yourself to the hospital. MAKE SURE YOU:  Understand these instructions.  Will watch your condition.  Will get help right away if you are not doing well or get worse.   This information is not intended to replace advice given to you by your health care provider. Make sure you discuss any questions you have with your health care provider.   Document Released: 12/26/2010 Document Revised: 11/28/2013 Document Reviewed: 05/25/2013 Elsevier Interactive Patient Education 2016 [employer]  Atrial Fibrillation Atrial fibrillation is a type of heartbeat that is irregular or fast (rapid). If you have this condition, your heart keeps quivering in a weird (chaotic) way. This condition can make it so your heart cannot pump blood normally. Having this condition gives a person more risk for  stroke, heart failure, and other heart problems. There are different types of atrial fibrillation. Talk with your doctor to learn about the type that you have. HOME CARE  Take over-the-counter and prescription medicines only as told by your doctor.  If your doctor prescribed a blood-thinning medicine, take it exactly as told. Taking too much of it can cause bleeding. If you do not take enough of it, you will not have the protection that you need against stroke and other problems.  Do not use any tobacco products. These include cigarettes, chewing tobacco, and e-cigarettes. If you need help quitting, ask your doctor.  If you have apnea (obstructive sleep apnea), manage it as told by your doctor.  Do not drink alcohol.  Do not drink beverages that have caffeine. These include coffee, soda, and tea.  Maintain a healthy weight. Do not use diet pills unless your doctor says they are safe for you. Diet pills may make heart problems worse.  Follow diet instructions as told by your doctor.  Exercise regularly as told by your doctor.  Keep all follow-up visits as told by your doctor. This is important. GET HELP IF:  You  notice a change in the speed, rhythm, or strength of your heartbeat.  You are taking a blood-thinning medicine and you notice more bruising.  You get tired more easily when you move or exercise. GET HELP RIGHT AWAY IF:  You have pain in your chest or your belly (abdomen).  You have sweating or weakness.  You feel sick to your stomach (nauseous).  You notice blood in your throw up (vomit), poop (stool), or pee (urine).  You are short of breath.  You suddenly have swollen feet and ankles.  You feel dizzy.  Your suddenly get weak or numb in your face, arms, or legs, especially if it happens on one side of your body.  You have trouble talking, trouble understanding, or both.  Your face or your eyelid droops on one side. These symptoms may be an emergency. Do not  wait to see if the symptoms will go away. Get medical help right away. Call your local emergency services (911 in the [place] Do not drive yourself to the hospital.   This information is not intended to replace advice given to you by your health care provider. Make sure you discuss any questions you have with your health care provider.   Document Released: 09/01/2008 Document Revised: 08/14/2015 Document Reviewed: 03/20/2015 Elsevier Interactive Patient Education 2016 [employer]  Bradycardia Bradycardia is a slower-than-normal heart rate. A normal resting heart rate for an adult ranges from 60 to 100 beats per minute. With bradycardia, the resting heart rate is less than 60 beats per minute. Bradycardia is a problem if your heart cannot pump enough oxygen-rich blood through your body. Bradycardia is not a problem for everyone. For some healthy adults, a slow resting heart rate is normal.  CAUSES  Bradycardia may be caused by:  A problem with the heart's electrical system, such as heart block.  A problem with the heart's natural pacemaker (sinus node).  Heart disease, damage, or infection.  Certain medicines that treat heart conditions.  Certain conditions, such as hypothyroidism and obstructive sleep apnea. RISK FACTORS  Risk factors include:  Being 25 or older.  Having high blood pressure (hypertension), high cholesterol (hyperlipidemia), or diabetes.  Drinking heavily, using tobacco products, or using drugs.  Being stressed. SIGNS AND SYMPTOMS  Signs and symptoms include:  Light-headedness.  Faintingor near fainting.  Fatigue and weakness.  Shortness of breath.  Chest pain (angina).  Drowsiness.  Confusion.  Dizziness. DIAGNOSIS  Diagnosis of bradycardia may include:  A physical exam.  An electrocardiogram (ECG).  Blood tests. TREATMENT  Treatment for bradycardia may include:  Treatment of an underlying condition.  Pacemaker placement. A pacemaker  is a small, battery-powered device that is placed under the skin and is programmed to sense your heartbeats. If your heart rate is lower than the programmed rate, the pacemaker will pace your heart.  Changing your medicines or dosages. HOME CARE INSTRUCTIONS  Take medicines only as directed by your health care provider.  Manage any health conditions that contribute to bradycardia as directed by your health care provider.  Follow a heart-healthy diet. A dietitian can help educate you on healthy food options and changes.  Follow an exercise program approved by your health care provider.  Maintain a healthy weight. Lose weight as approved by your health care provider.  Do not use tobacco products, including cigarettes, chewing tobacco, or electronic cigarettes. If you need help quitting, ask your health care provider.  Do not use illegal drugs.  Limit alcohol intake to no  more than 1 drink per day for nonpregnant women and 2 drinks per day for men. One drink equals 12 ounces of beer, 5 ounces of wine, or 1 ounces of hard liquor.  Keep all follow-up visits as directed by your health care provider. This is important. SEEK MEDICAL CARE IF:  You feel light-headed or dizzy.  You almost faint.  You feel weak or are easily fatigued during physical activity.  You experience confusion or have memory problems. SEEK IMMEDIATE MEDICAL CARE IF:   You faint.  You have an irregular heartbeat.  You have chest pain.  You have trouble breathing. MAKE SURE YOU:   Understand these instructions.  Will watch your condition.  Will get help right away if you are not doing well or get worse.   This information is not intended to replace advice given to you by your health care provider. Make sure you discuss any questions you have with your health care provider.   Document Released: 08/15/2002 Document Revised: 12/14/2014 Document Reviewed: 02/28/2014 Elsevier Interactive Patient Education  [employer].

## 2015-12-10 NOTE — ED Notes (Signed)
Pt presents with left sided chest pain stabbing in nature started at 630 this am and has been constant, pt tried tums with no relief and then tried two nitro tabs with some relief but then came back. Pt states pain radiates down into left arm and left shoulder. Pt with hx of cardiac stents.

## 2015-12-10 NOTE — ED Notes (Signed)
Left sided chest pressure that began this am, radiates to left arm, nausea with dry heaving in triage. Hx of 4 stents and bypass surgery. Pt was resting in bed when pain began.

## 2015-12-10 NOTE — ED Provider Notes (Addendum)
[facility] Emergency Department Provider Note  ____________________________________________   I have reviewed the triage vital signs and the nursing notes.   HISTORY  Chief Complaint Chest Pain and Nausea    HPI [name] is a 82 y.o. male With extensive history including a lower abdominal aortic aneurysm status post remote repair, as well as CAD hypertension presents today complaining of left-sided chest wall pain. He states he fell a few days ago did not hit his head had no symptoms until this morning when he woke up and his ribs were hurting on the left. Hurts when he touches it or when he changes position. He states the pain made him nauseated but he had no shortness of breath and no radiation of the pain no abdominal pain and difficulty eating or drinking no headache no stiff neck no head injury from the fall he states. The fall he states was about a week ago. He states that he has had no leg swelling or abdominal pain. Did not vomit. He states as long as he lies still he has minimaldiscomfort but when he tries to move around in the bed pain increases.does state he has a history of anticoagulation from atrial fibrillation. Denies shortness of breath.  Past Medical History  Diagnosis Date  . Aneurysm ([place])   . Hypertension     There are no active problems to display for this patient.   Past Surgical History  Procedure Laterality Date  . Cardiac surgery      No current outpatient prescriptions on file.  Allergies Penicillins  No family history on file.  Social History Social History  Substance Use Topics  . Smoking status: Former Smoker    Types: Cigarettes  . Smokeless tobacco: None  . Alcohol Use: Yes     Comment: wine occas.     Review of Systems Constitutional: No fever/chills Eyes: No visual changes. ENT: No sore throat. No stiff neck no neck pain Cardiovascular: positivechest pain. Respiratory: Denies shortness of  breath. Gastrointestinal:   no vomiting.  No diarrhea.  No constipation. Genitourinary: Negative for dysuria. Musculoskeletal: Negative lower extremity swelling Skin: Negative for rash. Neurological: Negative for headaches, focal weakness or numbness. 10-point ROS otherwise negative.  ____________________________________________   PHYSICAL EXAM:  VITAL SIGNS: ED Triage Vitals  Enc Vitals Group     BP 12/10/15 1011 169/75 mmHg     Pulse Rate 12/10/15 1011 54     Resp 12/10/15 1011 18     Temp 12/10/15 1011 97.5 F (36.4 C)     Temp Source 12/10/15 1011 Oral     SpO2 12/10/15 1011 99 %     Weight 12/10/15 1011 202 lb (91.627 kg)     Height 12/10/15 1011 6\' 1"  (1.854 m)     Head Cir --      Peak Flow --      Pain Score 12/10/15 1008 6     Pain Loc --      Pain Edu? --      Excl. in [place]? --     Constitutional: Alert and oriented. Well appearing and in no acute distress. Eyes: Conjunctivae are normal. PERRL. EOMI. Head: Atraumatic. Nose: No congestion/rhinnorhea. Mouth/Throat: Mucous membranes are moist.  Oropharynx non-erythematous. Neck: No stridor.   Nontender with no meningismus Cardiovascular: irregularly irregular. Grossly normal heart sounds.  Good peripheral circulation. Respiratory: Normal respiratory effort.  No retractions. Lungs CTAB. Chest: Tender to palpation left chest wall lateral to the areola mitosis specific spot  that he indicated he states "ouch that's the pain right there" and pulls back. There is no flail chest and there is no crepitus but this definitely reproduces his pain. Abdominal: Soft and nontender. No distention. No guarding no rebound Back:  There is no focal tenderness or step off there is no midline tenderness there are no lesions noted. there is no CVA tenderness Musculoskeletal: No lower extremity tenderness. No joint effusions, no DVT signs strong distal pulses no edema Neurologic:  Normal speech and language. No gross focal neurologic  deficits are appreciated.  Skin:  Skin is warm, dry and intact. No rash noted. Psychiatric: Mood and affect are normal. Speech and behavior are normal.  ____________________________________________   LABS (all labs ordered are listed, but only abnormal results are displayed)  Labs Reviewed  BASIC METABOLIC PANEL - Abnormal; Notable for the following:    Glucose, Bld 166 (*)    All other components within normal limits  CBC  TROPONIN I  HEPATIC FUNCTION PANEL  LIPASE, BLOOD  PROTIME-INR   ____________________________________________  EKG  I personally interpreted any EKGs ordered by me or triage Artifact somewhat limits interpretation likely atrial fibrillation rate 64 bpm no acute ST elevation or acute ST depression  Likely atrial fibrillation rate 41 bpm no acute ST elevation or depression no ischemia ____________________________________________  RADIOLOGY  I reviewed any imaging ordered by me or triage that were performed during my shift ____________________________________________   PROCEDURES  Procedure(s) performed: None  Critical Care performed: None  ____________________________________________   INITIAL IMPRESSION / ASSESSMENT AND PLAN / ED COURSE  Pertinent labs & imaging results that were available during my care of the patient were reviewed by me and considered in my medical decision making (see chart for details).  Patient with very reproducible chest wall pain but a significant cardiac history, we will obtain troponins basic blood work chest x-ray give him pain medication and reassess. Low suspicion for dissection, PE or ACS given exam but given his age with her on the side of caution and do a workup.   ----------------------------------------- 12:50 PM on 12/10/2015 -----------------------------------------  After morphine patient feels much better, he has still reproducible chest wall pain in the exact spot that he does gets but otherwise no  significant discomfort he feels much more at ease. His been no vomiting or shortness of breath here. Chest x-ray and blood work is initially negative given his age and comorbidities will send a second troponin and reassess  ----------------------------------------- 2:34 PM on 12/10/2015 -----------------------------------------  Discussed with Dr. Nehemiah Massed cleaning patient's occasional bradycardia. Patient has no pain at this time. Ifif he moves the wrong way he can elicit the discomfort but otherwise he is pain-free. Dr. Garrison [place] ski feels very [facility] discharge the patient does not feel the patient needs to be admitted. He is the personal cardiologist for this patient. They will follow-up with him tomorrow morning. Extensive return precautions and follow-up given and understood by the patient. Patient is eager to go home. We will discharge him with close outpatient follow-up with his cardiologist. At this time, there does not appear to be clinical evidence to support the diagnosis of pulmonary embolus, dissection, myocarditis, endocarditis, pericarditis, pericardial tamponade, acute coronary syndrome, pneumothorax, pneumonia, or any other acute intrathoracic pathology that will require admission or acute intervention. Nor is there evidence of any significant intra-abdominal pathology causing this discomfort.   I did discuss with Dr. Nehemiah Massed that the patient appears to be in a slow A. Fib versus junctional, he  agrees with management does not wish the patient to stay in the hospital he will take care of him at home. He states that if the patient came in to his office with these complaints and these findings managed him as an outpatient. Given that there is no Cardiologic indication for acute admission, negative troponins and reassuring blood work we'll discharge the patient with close outpatient follow-up with Dr. Nehemiah Massed. Pressures have been stable  here. ____________________________________________   FINAL CLINICAL IMPRESSION(S) / ED DIAGNOSES  Final diagnoses:  None     Schuyler Amor, MD 12/10/15 1053  Schuyler Amor, MD 12/10/15 [place], MD 12/10/15 [place], MD 12/10/15 [place], MD 12/10/15 [phone]

## 2015-12-10 NOTE — ED Notes (Signed)
Pt was transported to xray prior to RN assessment.

## 2016-03-18 DIAGNOSIS — I482 Chronic atrial fibrillation, unspecified: Secondary | ICD-10-CM | POA: Insufficient documentation

## 2016-03-18 HISTORY — DX: Chronic atrial fibrillation, unspecified: I48.20

## 2017-04-19 DIAGNOSIS — R001 Bradycardia, unspecified: Secondary | ICD-10-CM | POA: Insufficient documentation

## 2017-06-28 ENCOUNTER — Other Ambulatory Visit (INDEPENDENT_AMBULATORY_CARE_PROVIDER_SITE_OTHER): Payer: Self-pay | Admitting: Vascular Surgery

## 2017-06-28 DIAGNOSIS — Z95828 Presence of other vascular implants and grafts: Secondary | ICD-10-CM

## 2017-06-29 ENCOUNTER — Encounter (INDEPENDENT_AMBULATORY_CARE_PROVIDER_SITE_OTHER): Payer: Self-pay

## 2017-06-29 ENCOUNTER — Ambulatory Visit (INDEPENDENT_AMBULATORY_CARE_PROVIDER_SITE_OTHER): Payer: Medicare Other | Admitting: Vascular Surgery

## 2017-06-29 ENCOUNTER — Encounter (INDEPENDENT_AMBULATORY_CARE_PROVIDER_SITE_OTHER): Payer: Self-pay | Admitting: Vascular Surgery

## 2017-06-29 ENCOUNTER — Ambulatory Visit (INDEPENDENT_AMBULATORY_CARE_PROVIDER_SITE_OTHER): Payer: Medicare Other

## 2017-06-29 VITALS — BP 142/74 | HR 49 | Resp 16 | Wt 194.0 lb

## 2017-06-29 DIAGNOSIS — I714 Abdominal aortic aneurysm, without rupture, unspecified: Secondary | ICD-10-CM

## 2017-06-29 DIAGNOSIS — I10 Essential (primary) hypertension: Secondary | ICD-10-CM | POA: Diagnosis not present

## 2017-06-29 DIAGNOSIS — Z95828 Presence of other vascular implants and grafts: Secondary | ICD-10-CM | POA: Diagnosis not present

## 2017-06-30 DIAGNOSIS — I10 Essential (primary) hypertension: Secondary | ICD-10-CM | POA: Insufficient documentation

## 2017-06-30 DIAGNOSIS — I714 Abdominal aortic aneurysm, without rupture, unspecified: Secondary | ICD-10-CM | POA: Insufficient documentation

## 2017-06-30 NOTE — Assessment & Plan Note (Signed)
His aortic duplex today shows a patent stent graft without endoleak with continued shrinkage of his aneurysm sac now measuring less than 4 cm in maximal diameter. He is doing well. We will continue to follow this on an annual basis with duplex.

## 2017-06-30 NOTE — Assessment & Plan Note (Signed)
blood pressure control important in reducing the progression of atherosclerotic disease and aneurysmal disease. On appropriate oral medications.  

## 2017-06-30 NOTE — Progress Notes (Signed)
MRN : [id number]  [name] is a 83 y.o. (Aug 18, 1933) male who presents with chief complaint of  Chief Complaint  Patient presents with  . ultrasound follow up  .  History of Present Illness: Patient returns today in follow up of AAA. He is several years status post endovascular repair. He has lost 36 pounds with diligent effort from reducing his portions and avoiding sweets. He is doing well without any aneurysm related symptoms. Specifically, the patient denies new back or abdominal pain, or signs of peripheral embolization. His aortic duplex today shows a patent stent graft without endoleak with continued shrinkage of his aneurysm sac now measuring less than 4 cm in maximal diameter.  Current Outpatient Prescriptions  Medication Sig Dispense Refill  . amLODipine-benazepril (LOTREL) 5-20 MG capsule Take 1 capsule by mouth daily.    Marland Kitchen aspirin EC 81 MG tablet Take 81 mg by mouth daily.    Marland Kitchen atorvastatin (LIPITOR) 20 MG tablet Take 20 mg by mouth daily.    Marland Kitchen doxycycline (VIBRAMYCIN) 100 MG capsule     . FLUoxetine (PROZAC) 20 MG capsule Take 20 mg by mouth daily.    Marland Kitchen FLUoxetine (PROZAC) 20 MG capsule Take by mouth.    Marland Kitchen ipratropium (ATROVENT) 0.06 % nasal spray     . isosorbide mononitrate (IMDUR) 30 MG 24 hr tablet     . metoprolol succinate (TOPROL-XL) 25 MG 24 hr tablet Take 25 mg by mouth daily.    . ranitidine (ZANTAC) 150 MG capsule     . venlafaxine (EFFEXOR) 37.5 MG tablet Take by mouth.    . clopidogrel (PLAVIX) 75 MG tablet Take 75 mg by mouth daily.    Marland Kitchen gabapentin (NEURONTIN) 100 MG capsule Take 100 mg by mouth daily.    Marland Kitchen HYDROcodone-acetaminophen (NORCO/VICODIN) 5-325 MG tablet Take 1-2 tablets by mouth every 4 (four) hours as needed for moderate pain. (Patient not taking: Reported on 06/29/2017) 12 tablet 0  . lansoprazole (PREVACID) 30 MG capsule Take 30 mg by mouth daily.     . propranolol (INDERAL) 20 MG tablet Take 20 mg by mouth daily.      No current  facility-administered medications for this visit.     Past Medical History:  Diagnosis Date  . Aneurysm ([place])   . Hypertension     Past Surgical History:  Procedure Laterality Date  . CARDIAC SURGERY      Social History Social History  Substance Use Topics  . Smoking status: Former Smoker    Types: Cigarettes  . Smokeless tobacco: Never Used  . Alcohol use Yes     Comment: wine occas.     Family History Family History  Problem Relation Age of Onset  . Cancer Mother   . Heart disease Mother   . Heart attack Father   . Heart disease Father     Allergies  Allergen Reactions  . Shellfish-Derived Products Rash    Systemic Crabs Only   . Penicillins Itching, Swelling and Other (See Comments)    Has patient had a PCN reaction causing immediate rash, facial/tongue/throat swelling, SOB or lightheadedness with hypotension: Yes Has patient had a PCN reaction causing severe rash involving mucus membranes or skin necrosis: No Has patient had a PCN reaction that required hospitalization No Has patient had a PCN reaction occurring within the last 10 years: No If all of the above answers are "NO", then may proceed with Cephalosporin use.     REVIEW OF SYSTEMS (Negative unless checked)  Constitutional: [] Weight loss  [] Fever  [] Chills Cardiac: [] Chest pain   [] Chest pressure   [] Palpitations   [] Shortness of breath when laying flat   [] Shortness of breath at rest   [] Shortness of breath with exertion. Vascular:  [] Pain in legs with walking   [] Pain in legs at rest   [] Pain in legs when laying flat   [] Claudication   [] Pain in feet when walking  [] Pain in feet at rest  [] Pain in feet when laying flat   [] History of DVT   [] Phlebitis   [] Swelling in legs   [] Varicose veins   [] Non-healing ulcers Pulmonary:   [] Uses home oxygen   [] Productive cough   [] Hemoptysis   [] Wheeze  [] COPD   [] Asthma Neurologic:  [] Dizziness  [] Blackouts   [] Seizures   [] History of stroke   [] History of TIA   [] Aphasia   [] Temporary blindness   [] Dysphagia   [] Weakness or numbness in arms   [] Weakness or numbness in legs Musculoskeletal:  [x] Arthritis   [] Joint swelling   [] Joint pain   [] Low back pain Hematologic:  [] Easy bruising  [] Easy bleeding   [] Hypercoagulable state   [] Anemic   Gastrointestinal:  [] Blood in stool   [] Vomiting blood  [] Gastroesophageal reflux/heartburn   [] Abdominal pain Genitourinary:  [] Chronic kidney disease   [] Difficult urination  [] Frequent urination  [] Burning with urination   [] Hematuria Skin:  [] Rashes   [] Ulcers   [] Wounds Psychological:  [] History of anxiety   []  History of major depression.  Physical Examination  BP (!) 142/74   Pulse (!) 49   Resp 16   Wt 88 kg (194 lb)   BMI 25.60 kg/m  Gen:  WD/WN, NAD. Appears younger than stated age Head: [place]/AT, No temporalis wasting. Ear/Nose/Throat: Hearing grossly intact, nares w/o erythema or drainage, trachea midline Eyes: Conjunctiva clear. Sclera non-icteric Neck: Supple.  No JVD.  Pulmonary:  Good air movement, no use of accessory muscles.  Cardiac: RRR, normal S1, S2 Vascular:  Vessel Right Left  Radial Palpable Palpable                          PT Palpable Palpable  DP Palpable Palpable   Gastrointestinal: soft, non-tender/non-distended. No Increased aortic impulse  Musculoskeletal: M/S 5/5 throughout.  No deformity or atrophy.  Neurologic: Sensation grossly intact in extremities.  Symmetrical.  Speech is fluent.  Psychiatric: Judgment intact, Mood & affect appropriate for pt's clinical situation. Dermatologic: No rashes or ulcers noted.  No cellulitis or open wounds. Lymph : No Cervical, Axillary, or Inguinal lymphadenopathy.      Labs No results found for this or any previous visit (from the past 2160 hour(s)).  Radiology No results found.  Assessment/Plan  Hypertension blood pressure control important in reducing the progression of atherosclerotic disease and aneurysmal disease.  On appropriate oral medications.   AAA (abdominal aortic aneurysm) without rupture (HCC) His aortic duplex today shows a patent stent graft without endoleak with continued shrinkage of his aneurysm sac now measuring less than 4 cm in maximal diameter. He is doing well. We will continue to follow this on an annual basis with duplex.    Leotis Pain, MD  06/30/2017 7:57 AM    This note was created with Dragon medical transcription system.  Any errors from dictation are purely unintentional

## 2017-08-05 DIAGNOSIS — I700 Atherosclerosis of aorta: Secondary | ICD-10-CM | POA: Insufficient documentation

## 2017-09-28 IMAGING — MR MR BRAIN/IAC WO/W
8 of 11 series · 36 of 48 positions shown · IV contrast (multihance)
Comparison: Head CT 08/21/2010

CLINICAL DATA: Dizziness and right ear pain.

EXAM:
MR BRAIN/IAC WITHOUT AND WITH CONTRAST
TECHNIQUE: Multiplanar, multisequence MR imaging was performed both before and
after administration of intravenous contrast.
CONTRAST:  19mL MULTIHANCE GADOBENATE DIMEGLUMINE 529 MG/ML IV SOLN

[Series 2: T1 · sagittal · 5.0mm · 0.45mm/px · 4 of 27 slices shown (1 of 3)]
[im 1/27]
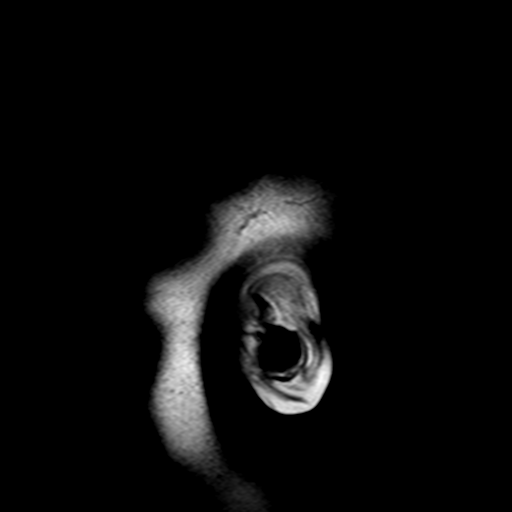
[im 9/27]
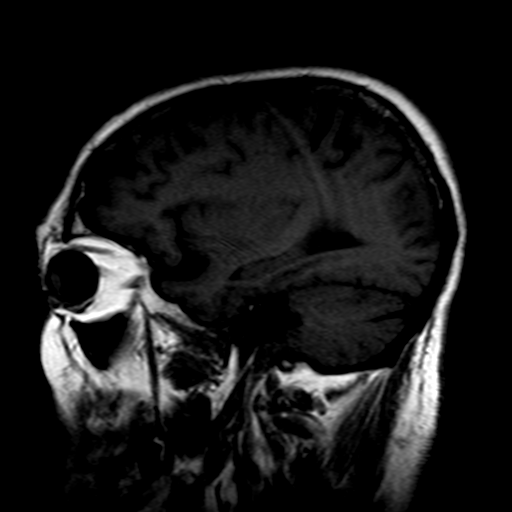
[im 18/27]
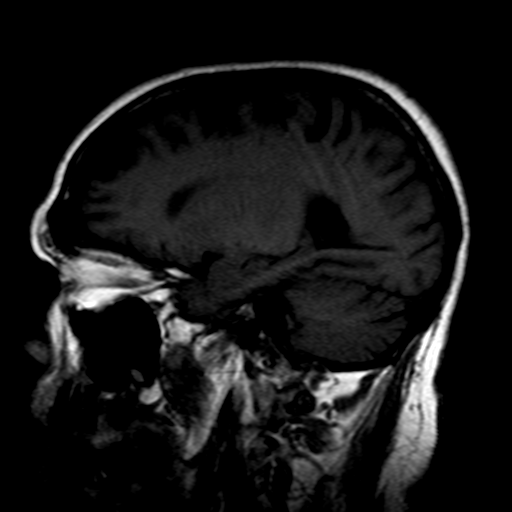
[im 27/27]
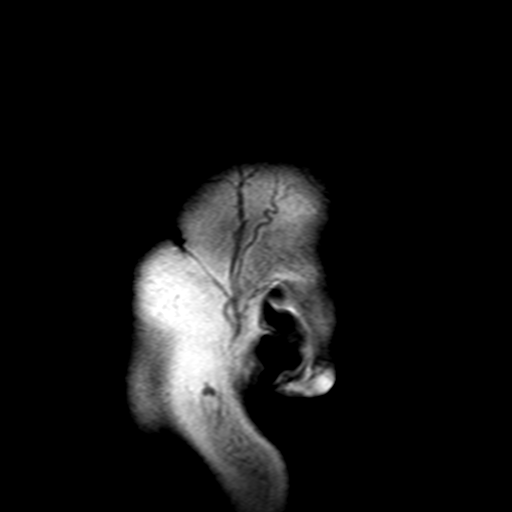

[Series 4: DWI · axial · 4.0mm · 0.94mm/px · z∈[-57,+107]mm · 6 of 42 slices shown (1 of 2)]
[im 1/42]
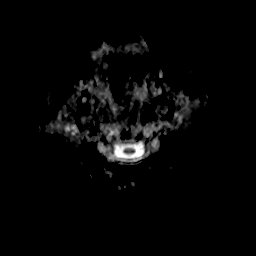
[im 9/42]
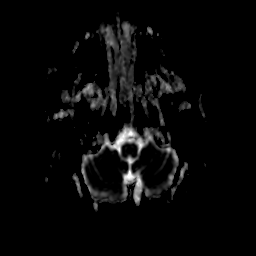
[im 17/42]
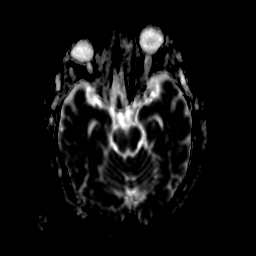
[im 25/42]
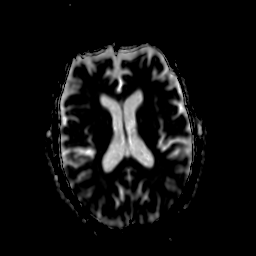
[im 33/42]
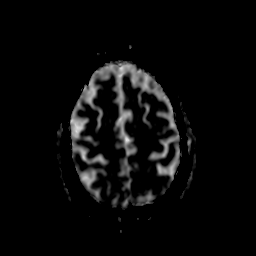
[im 42/42]
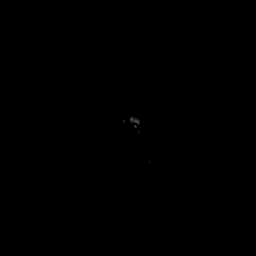

[Series 5: DWI · axial · 4.0mm · 0.94mm/px · z∈[-57,+107]mm · 6 of 42 slices shown (2 of 2)]
[im 1/42]
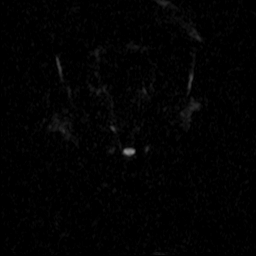
[im 9/42]
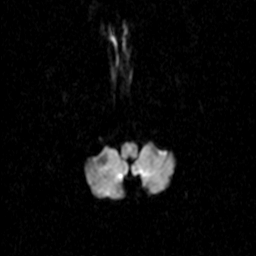
[im 17/42]
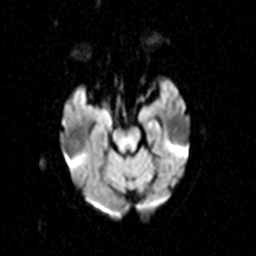
[im 25/42]
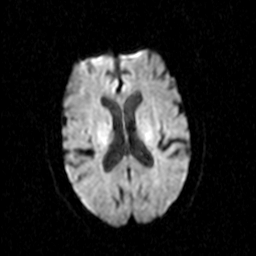
[im 33/42]
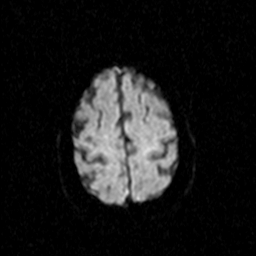
[im 42/42]
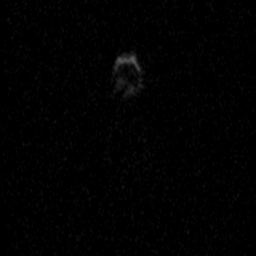

[Series 6: T2 · axial · 5.0mm · 0.45mm/px · z∈[-52,+110]mm · 4 of 26 slices shown]
[im 1/26]
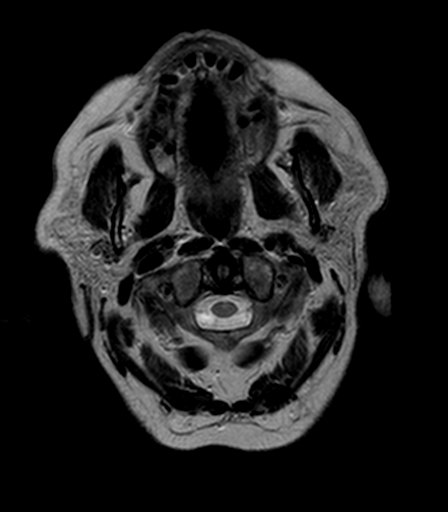
[im 9/26]
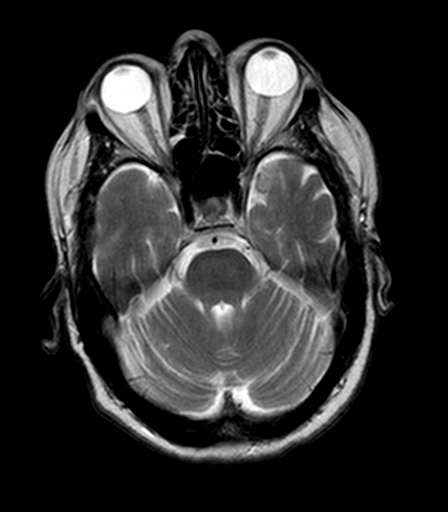
[im 17/26]
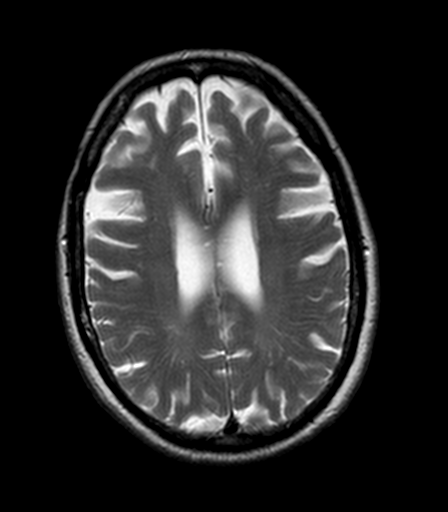
[im 26/26]
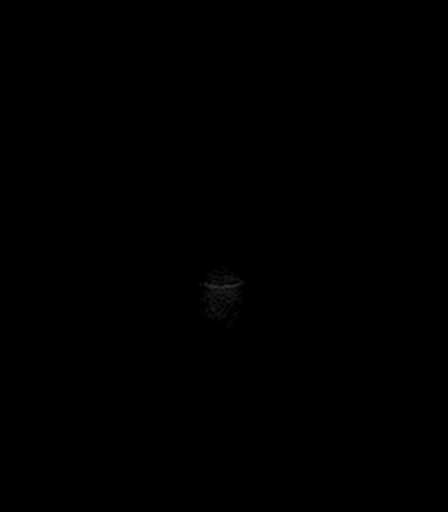

[Series 7: FLAIR · axial · 5.0mm · 0.90mm/px · z∈[-52,+110]mm · 4 of 26 slices shown]
[im 1/26]
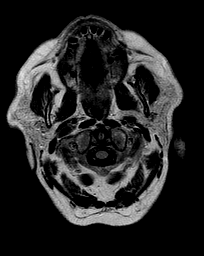
[im 9/26]
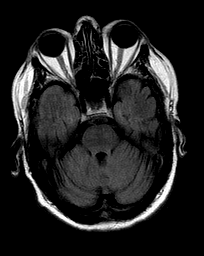
[im 17/26]
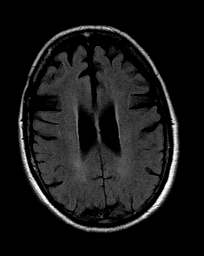
[im 26/26]
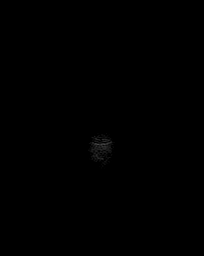

[Series 8: T1 · coronal · 3.0mm · 0.70mm/px · 2 of 11 slices shown (2 of 3)]
[im 1/11]
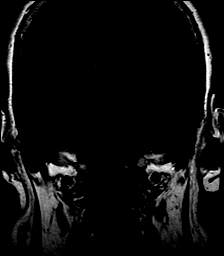
[im 11/11]
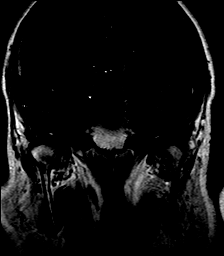

[Series 10: T1 · axial · 3.0mm · 0.70mm/px · z∈[-26,+4]mm · 2 of 11 slices shown (3 of 3)]
[im 1/11]
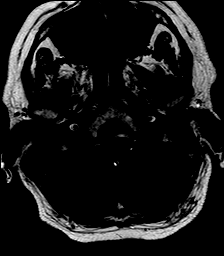
[im 11/11]
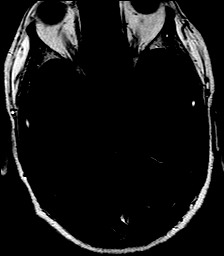

[Series 13: T1 post-contrast · axial · 3.0mm · 0.45mm/px · z∈[-55,+109]mm · 8 of 56 slices shown]
[im 1/56]
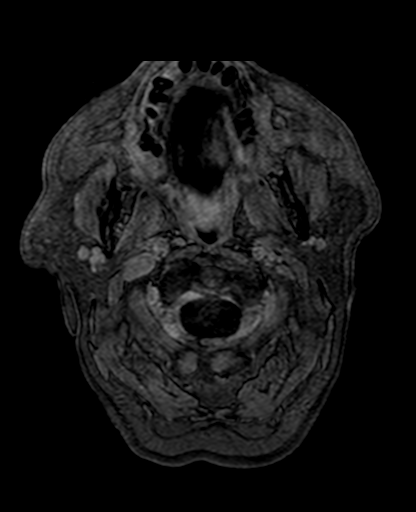
[im 8/56]
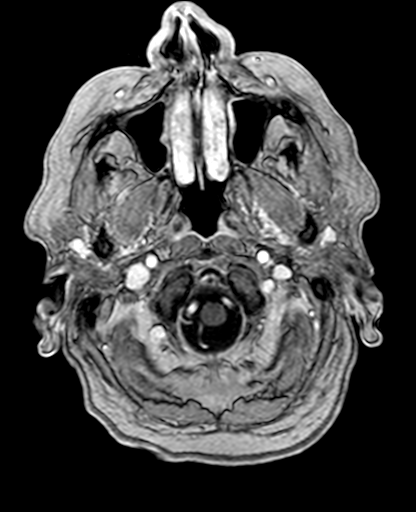
[im 16/56]
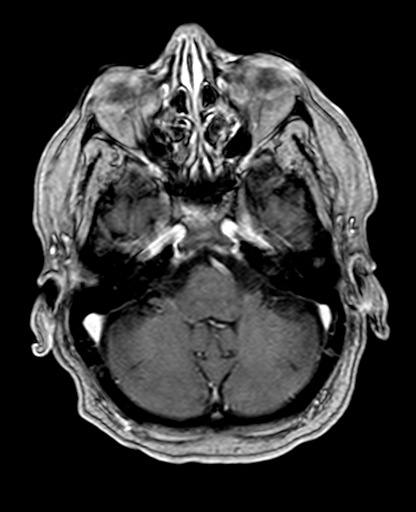
[im 24/56]
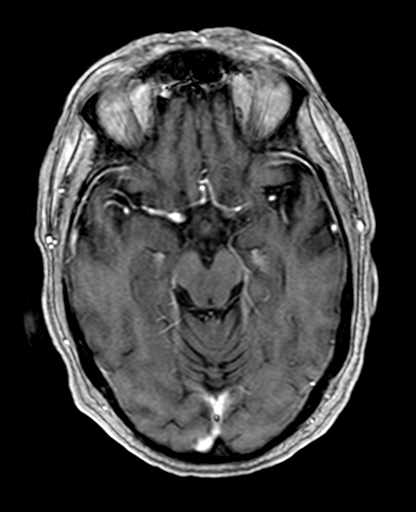
[im 32/56]
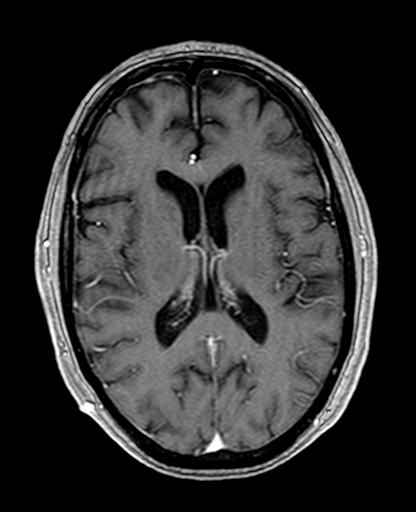
[im 40/56]
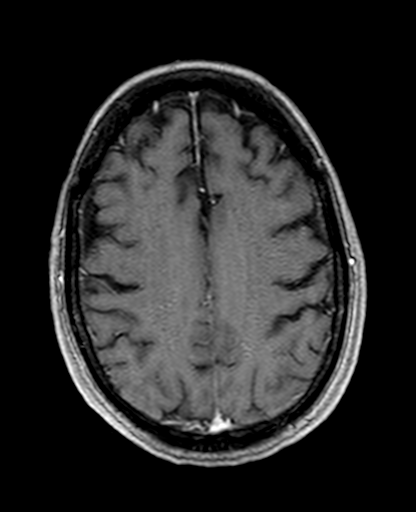
[im 48/56]
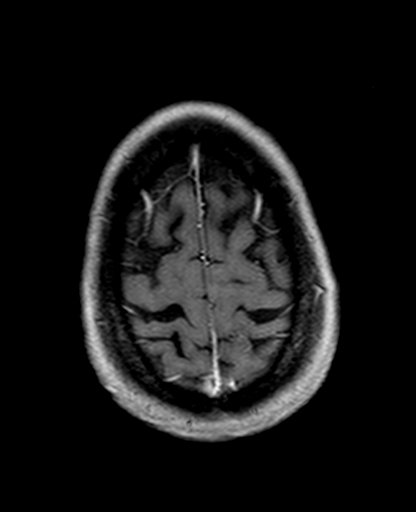
[im 56/56]
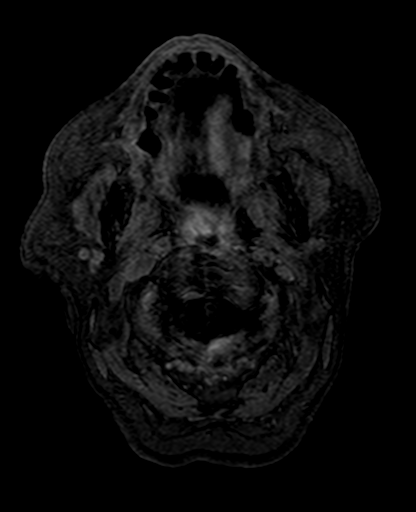

[36 of 48 positions shown; findings below may reference images not displayed]

FINDINGS: There is symmetric and normal labyrinthine signal and absent
enhancement. No mass or deflection of the vestibulocochlear nerves.
No acute or prior brainstem infarction.

Calvarium and upper cervical spine: No focal marrow signal
abnormality.

Orbits: No significant findings.

Sinuses and Mastoids: Clear.

Brain: No infarct, hemorrhage, hydrocephalus, or mass lesion. No
evidence of large vessel occlusion. No abnormal volume loss or white
matter ischemic changes for age. There have been small bilateral
cerebellar infarcts. Diminutive basilar in the setting of fetal type
posterior cerebral arteries.
IMPRESSION: 1. No explanation for right ear symptoms.
2. Small remote bilateral cerebellar infarcts.

## 2018-04-06 DIAGNOSIS — F329 Major depressive disorder, single episode, unspecified: Secondary | ICD-10-CM | POA: Insufficient documentation

## 2018-04-06 DIAGNOSIS — F419 Anxiety disorder, unspecified: Secondary | ICD-10-CM | POA: Insufficient documentation

## 2018-06-28 ENCOUNTER — Other Ambulatory Visit (INDEPENDENT_AMBULATORY_CARE_PROVIDER_SITE_OTHER): Payer: Medicare Other

## 2018-06-28 ENCOUNTER — Ambulatory Visit (INDEPENDENT_AMBULATORY_CARE_PROVIDER_SITE_OTHER): Payer: Medicare Other | Admitting: Vascular Surgery

## 2018-07-04 DIAGNOSIS — R42 Dizziness and giddiness: Secondary | ICD-10-CM | POA: Insufficient documentation

## 2018-07-18 ENCOUNTER — Ambulatory Visit: Payer: No Typology Code available for payment source

## 2018-07-18 ENCOUNTER — Ambulatory Visit
Admission: RE | Admit: 2018-07-18 | Discharge: 2018-07-18 | Disposition: A | Discharge status: Home / Self Care | Payer: Medicare Other | Source: Ambulatory Visit | Attending: Cardiology | Admitting: Cardiology

## 2018-07-18 ENCOUNTER — Encounter
Admission: RE | Admit: 2018-07-18 | Discharge: 2018-07-18 | Disposition: A | Discharge status: Home / Self Care | Payer: Medicare Other | Source: Ambulatory Visit | Attending: Cardiology | Admitting: Cardiology

## 2018-07-18 ENCOUNTER — Other Ambulatory Visit: Payer: Self-pay

## 2018-07-18 DIAGNOSIS — Z950 Presence of cardiac pacemaker: Secondary | ICD-10-CM | POA: Insufficient documentation

## 2018-07-18 DIAGNOSIS — I495 Sick sinus syndrome: Secondary | ICD-10-CM | POA: Diagnosis not present

## 2018-07-18 DIAGNOSIS — R9431 Abnormal electrocardiogram [ECG] [EKG]: Secondary | ICD-10-CM | POA: Diagnosis not present

## 2018-07-18 DIAGNOSIS — I517 Cardiomegaly: Secondary | ICD-10-CM | POA: Insufficient documentation

## 2018-07-18 HISTORY — DX: Gastro-esophageal reflux disease without esophagitis: K21.9

## 2018-07-18 HISTORY — DX: Atherosclerotic heart disease of native coronary artery without angina pectoris: I25.10

## 2018-07-18 HISTORY — DX: Personal history of urinary calculi: Z87.442

## 2018-07-18 HISTORY — DX: Hyperlipidemia, unspecified: E78.5

## 2018-07-18 LAB — CBC
HEMATOCRIT: 43.2 % (ref 40.0–52.0)
HEMOGLOBIN: 15.7 g/dL (ref 13.0–18.0)
MCH: 33.5 pg (ref 26.0–34.0)
MCHC: 36.2 g/dL — ABNORMAL HIGH (ref 32.0–36.0)
MCV: 92.5 fL (ref 80.0–100.0)
Platelets: 158 10*3/uL (ref 150–440)
RBC: 4.67 MIL/uL (ref 4.40–5.90)
RDW: 14.3 % (ref 11.5–14.5)
WBC: 7.4 10*3/uL (ref 3.8–10.6)

## 2018-07-18 LAB — PROTIME-INR
INR: 1.06
Prothrombin Time: 13.7 seconds (ref 11.4–15.2)

## 2018-07-18 LAB — DIFFERENTIAL
Basophils Absolute: 0.1 10*3/uL (ref 0–0.1)
Basophils Relative: 1 %
EOS PCT: 4 %
Eosinophils Absolute: 0.3 10*3/uL (ref 0–0.7)
LYMPHS ABS: 1.3 10*3/uL (ref 1.0–3.6)
LYMPHS PCT: 18 %
MONOS PCT: 9 %
Monocytes Absolute: 0.6 10*3/uL (ref 0.2–1.0)
NEUTROS PCT: 68 %
Neutro Abs: 5.1 10*3/uL (ref 1.4–6.5)

## 2018-07-18 LAB — BASIC METABOLIC PANEL
Anion gap: 7 (ref 5–15)
BUN: 16 mg/dL (ref 8–23)
CO2: 27 mmol/L (ref 22–32)
CREATININE: 1.11 mg/dL (ref 0.61–1.24)
Calcium: 9.2 mg/dL (ref 8.9–10.3)
Chloride: 108 mmol/L (ref 98–111)
GFR calc Af Amer: >60 mL/min (ref >60)
GFR, EST NON AFRICAN AMERICAN: 60 mL/min — AB (ref >60)
Glucose, Bld: 115 mg/dL — ABNORMAL HIGH (ref 70–99)
POTASSIUM: 4.1 mmol/L (ref 3.5–5.1)
SODIUM: 142 mmol/L (ref 135–145)

## 2018-07-18 LAB — APTT: aPTT: 36 seconds (ref 24–36)

## 2018-07-18 LAB — SURGICAL PCR SCREEN
MRSA, PCR: NEGATIVE
STAPHYLOCOCCUS AUREUS: POSITIVE — AB

## 2018-07-18 NOTE — Patient Instructions (Signed)
Your procedure is scheduled on: Tuesday 07/26/18 Report to [place]. To find out your arrival time please call [phone] between 1PM - 3PM on Monday 07/25/18.  Remember: Instructions that are not followed completely may result in serious medical risk, up to and including death, or upon the discretion of your surgeon and anesthesiologist your surgery may need to be rescheduled.     _X__ 1. Do not eat food after midnight the night before your procedure.                 No gum chewing or hard candies. You may drink clear liquids up to 2 hours                 before you are scheduled to arrive for your surgery- DO not drink clear                 liquids within 2 hours of the start of your surgery.                 Clear Liquids include:  water, apple juice without pulp, clear carbohydrate                 drink such as Clearfast or Gatorade, Black Coffee or Tea (Do not add                 anything to coffee or tea).  __X__2.  On the morning of surgery brush your teeth with toothpaste and water, you                 may rinse your mouth with mouthwash if you wish.  Do not swallow any              toothpaste of mouthwash.     _X__ 3.  No Alcohol for 24 hours before or after surgery.   _X__ 4.  Do Not Smoke or use e-cigarettes For 24 Hours Prior to Your Surgery.                 Do not use any chewable tobacco products for at least 6 hours prior to                 surgery.  ____  5.  Bring all medications with you on the day of surgery if instructed.   __X__  6.  Notify your doctor if there is any change in your medical condition      (cold, fever, infections).     Do not wear jewelry, make-up, hairpins, clips or nail polish. Do not wear lotions, powders, or perfumes.  Do not shave 48 hours prior to surgery. Men may shave face and neck. Do not bring valuables to the hospital.    [facility] is not responsible for any belongings or  valuables.  Contacts, dentures/partials or body piercings may not be worn into surgery. Bring a case for your contacts, glasses or hearing aids, a denture cup will be supplied. Leave your suitcase in the car. After surgery it may be brought to your room. For patients admitted to the hospital, discharge time is determined by your treatment team.   Patients discharged the day of surgery will not be allowed to drive home.   Please read over the following fact sheets that you were given:   MRSA Information  __X__ Take these medicines the morning of surgery with A SIP OF WATER:  1. isosorbide mononitrate (IMDUR) 30 MG 24 hr tablet  2. ranitidine (ZANTAC) 150 MG capsule  3.   4.  5.  6.  ____ Fleet Enema (as directed)   __X__ Use CHG Soap/SAGE wipes as directed  ____ Use inhalers on the day of surgery  ____ Stop metformin/Janumet/Farxiga 2 days prior to surgery    ____ Take 1/2 of usual insulin dose the night before surgery. No insulin the morning          of surgery.   ____ Stop Blood Thinners Coumadin/Plavix/Xarelto/Pleta/Pradaxa/Eliquis/Effient/Aspirin  on   Or contact your Surgeon, Cardiologist or Medical Doctor regarding  ability to stop your blood thinners  __X__ Stop Anti-inflammatories 7 days before surgery such as Advil, Ibuprofen, Motrin,  BC or Goodies Powder, Naprosyn, Naproxen, Aleve, Aspirin STOP MELOXICAM TODAY YOU CAN TAKE TYLENOL FOR ACHES OR PAINS IF NEEDED   __X__ Stop all herbal supplements, fish oil or vitamin E until after surgery.    ____ Bring C-Pap to the hospital.

## 2018-07-18 NOTE — Pre-Procedure Instructions (Signed)
Copies of PCR and EKG faxed to Dr Saralyn Pilar office for review.

## 2018-07-25 MED ORDER — VANCOMYCIN HCL IN DEXTROSE 1-5 GM/200ML-% IV SOLN
1000.0000 mg | Freq: Once | INTRAVENOUS | Status: AC
  Administered 2018-07-26: 1000 mg via INTRAVENOUS
  Filled 2018-07-25: qty 200

## 2018-07-26 ENCOUNTER — Encounter
Admission: RE | Disposition: A | Discharge status: Home / Self Care | Payer: Self-pay | Source: Ambulatory Visit | Attending: Cardiology

## 2018-07-26 ENCOUNTER — Encounter: Payer: Self-pay | Admitting: *Deleted

## 2018-07-26 ENCOUNTER — Ambulatory Visit: Payer: Medicare Other

## 2018-07-26 ENCOUNTER — Observation Stay
Admission: RE | Admit: 2018-07-26 | Discharge: 2018-07-27 | Disposition: A | Discharge status: Home / Self Care | Payer: Medicare Other | Source: Ambulatory Visit | Attending: Cardiology | Admitting: Cardiology

## 2018-07-26 ENCOUNTER — Ambulatory Visit: Payer: Medicare Other | Admitting: Anesthesiology

## 2018-07-26 ENCOUNTER — Other Ambulatory Visit: Payer: Self-pay

## 2018-07-26 ENCOUNTER — Observation Stay: Payer: Medicare Other

## 2018-07-26 DIAGNOSIS — I495 Sick sinus syndrome: Secondary | ICD-10-CM | POA: Diagnosis not present

## 2018-07-26 DIAGNOSIS — Z950 Presence of cardiac pacemaker: Secondary | ICD-10-CM

## 2018-07-26 DIAGNOSIS — Z79899 Other long term (current) drug therapy: Secondary | ICD-10-CM | POA: Diagnosis not present

## 2018-07-26 DIAGNOSIS — I739 Peripheral vascular disease, unspecified: Secondary | ICD-10-CM | POA: Diagnosis not present

## 2018-07-26 DIAGNOSIS — Z8249 Family history of ischemic heart disease and other diseases of the circulatory system: Secondary | ICD-10-CM | POA: Insufficient documentation

## 2018-07-26 DIAGNOSIS — I482 Chronic atrial fibrillation: Secondary | ICD-10-CM | POA: Insufficient documentation

## 2018-07-26 DIAGNOSIS — I119 Hypertensive heart disease without heart failure: Secondary | ICD-10-CM | POA: Diagnosis not present

## 2018-07-26 DIAGNOSIS — Z7982 Long term (current) use of aspirin: Secondary | ICD-10-CM | POA: Insufficient documentation

## 2018-07-26 DIAGNOSIS — R42 Dizziness and giddiness: Secondary | ICD-10-CM | POA: Diagnosis not present

## 2018-07-26 DIAGNOSIS — Z791 Long term (current) use of non-steroidal anti-inflammatories (NSAID): Secondary | ICD-10-CM | POA: Diagnosis not present

## 2018-07-26 DIAGNOSIS — Z87891 Personal history of nicotine dependence: Secondary | ICD-10-CM | POA: Diagnosis not present

## 2018-07-26 DIAGNOSIS — I25810 Atherosclerosis of coronary artery bypass graft(s) without angina pectoris: Secondary | ICD-10-CM | POA: Insufficient documentation

## 2018-07-26 DIAGNOSIS — Z7951 Long term (current) use of inhaled steroids: Secondary | ICD-10-CM | POA: Diagnosis not present

## 2018-07-26 DIAGNOSIS — Z951 Presence of aortocoronary bypass graft: Secondary | ICD-10-CM | POA: Diagnosis not present

## 2018-07-26 DIAGNOSIS — I2510 Atherosclerotic heart disease of native coronary artery without angina pectoris: Secondary | ICD-10-CM | POA: Diagnosis present

## 2018-07-26 HISTORY — DX: Sick sinus syndrome: I49.5

## 2018-07-26 HISTORY — PX: PACEMAKER INSERTION: SHX728

## 2018-07-26 SURGERY — INSERTION, CARDIAC PACEMAKER
Anesthesia: General | Site: Chest | Laterality: Left | Wound class: Clean

## 2018-07-26 MED ORDER — AMLODIPINE BESYLATE 5 MG PO TABS
5.0000 mg | ORAL_TABLET | Freq: Every day | ORAL | Status: DC
  Administered 2018-07-26: 5 mg via ORAL
  Filled 2018-07-26: qty 1

## 2018-07-26 MED ORDER — ACETAMINOPHEN 325 MG PO TABS
325.0000 mg | ORAL_TABLET | ORAL | Status: DC | PRN
  Administered 2018-07-27: 650 mg via ORAL
  Filled 2018-07-26: qty 2

## 2018-07-26 MED ORDER — LIDOCAINE HCL (PF) 2 % IJ SOLN
INTRAMUSCULAR | Status: AC
  Filled 2018-07-26: qty 10

## 2018-07-26 MED ORDER — BENAZEPRIL HCL 20 MG PO TABS
20.0000 mg | ORAL_TABLET | Freq: Every day | ORAL | Status: DC
  Filled 2018-07-26 (×2): qty 1

## 2018-07-26 MED ORDER — GENTAMICIN SULFATE 40 MG/ML IJ SOLN
Freq: Once | INTRAMUSCULAR | Status: DC
  Filled 2018-07-26: qty 2

## 2018-07-26 MED ORDER — ISOSORBIDE MONONITRATE ER 30 MG PO TB24: 30.0000 mg | ORAL_TABLET | Freq: Every day | ORAL | Status: DC

## 2018-07-26 MED ORDER — LIDOCAINE HCL (CARDIAC) PF 100 MG/5ML IV SOSY
PREFILLED_SYRINGE | INTRAVENOUS | Status: DC | PRN
  Administered 2018-07-26: 60 mg via INTRAVENOUS

## 2018-07-26 MED ORDER — LACTATED RINGERS IV SOLN
INTRAVENOUS | Status: DC
  Administered 2018-07-26: 11:00:00 via INTRAVENOUS

## 2018-07-26 MED ORDER — ATORVASTATIN CALCIUM 20 MG PO TABS
20.0000 mg | ORAL_TABLET | Freq: Every day | ORAL | Status: DC
  Administered 2018-07-26: 20 mg via ORAL
  Filled 2018-07-26: qty 1

## 2018-07-26 MED ORDER — ONDANSETRON HCL 4 MG/2ML IJ SOLN
INTRAMUSCULAR | Status: AC
Start: 2018-07-26 — End: ?
  Filled 2018-07-26: qty 2

## 2018-07-26 MED ORDER — MELOXICAM 7.5 MG PO TABS
7.5000 mg | ORAL_TABLET | Freq: Every day | ORAL | Status: DC
  Administered 2018-07-26: 7.5 mg via ORAL
  Filled 2018-07-26: qty 1

## 2018-07-26 MED ORDER — LIDOCAINE 1 % OPTIME INJ - NO CHARGE
INTRAMUSCULAR | Status: DC | PRN
  Administered 2018-07-26: 28 mL

## 2018-07-26 MED ORDER — FENTANYL CITRATE (PF) 100 MCG/2ML IJ SOLN: 25.0000 ug | INTRAMUSCULAR | Status: DC | PRN

## 2018-07-26 MED ORDER — FENTANYL CITRATE (PF) 100 MCG/2ML IJ SOLN
INTRAMUSCULAR | Status: DC | PRN
  Administered 2018-07-26 (×2): 25 ug via INTRAVENOUS

## 2018-07-26 MED ORDER — ONDANSETRON HCL 4 MG/2ML IJ SOLN: 4.0000 mg | Freq: Four times a day (QID) | INTRAMUSCULAR | Status: DC | PRN

## 2018-07-26 MED ORDER — PROPOFOL 500 MG/50ML IV EMUL
INTRAVENOUS | Status: DC | PRN
  Administered 2018-07-26: 25 ug/kg/min via INTRAVENOUS

## 2018-07-26 MED ORDER — PROPOFOL 10 MG/ML IV BOLUS
INTRAVENOUS | Status: DC | PRN
  Administered 2018-07-26: 20 mg via INTRAVENOUS

## 2018-07-26 MED ORDER — FENTANYL CITRATE (PF) 100 MCG/2ML IJ SOLN
INTRAMUSCULAR | Status: AC
  Filled 2018-07-26: qty 2

## 2018-07-26 MED ORDER — SODIUM CHLORIDE 0.9 % IV SOLN
INTRAVENOUS | Status: DC | PRN
  Administered 2018-07-26: 100 mL

## 2018-07-26 MED ORDER — PROPOFOL 500 MG/50ML IV EMUL
INTRAVENOUS | Status: AC
  Filled 2018-07-26: qty 50

## 2018-07-26 MED ORDER — DEXAMETHASONE SODIUM PHOSPHATE 10 MG/ML IJ SOLN
INTRAMUSCULAR | Status: AC
  Filled 2018-07-26: qty 1

## 2018-07-26 MED ORDER — VANCOMYCIN HCL IN DEXTROSE 1-5 GM/200ML-% IV SOLN
1000.0000 mg | Freq: Two times a day (BID) | INTRAVENOUS | Status: AC
  Administered 2018-07-26: 1000 mg via INTRAVENOUS
  Filled 2018-07-26 (×3): qty 200

## 2018-07-26 SURGICAL SUPPLY — 34 items
BAG DECANTER FOR FLEXI CONT (MISCELLANEOUS) ×3 IMPLANT
BRUSH SCRUB EZ  4% CHG (MISCELLANEOUS) ×2
BRUSH SCRUB EZ 4% CHG (MISCELLANEOUS) ×1 IMPLANT
CABLE SURG 12 DISP A/V CHANNEL (MISCELLANEOUS) ×3 IMPLANT
CANISTER SUCT 1200ML W/VALVE (MISCELLANEOUS) ×3 IMPLANT
CHLORAPREP W/TINT 26ML (MISCELLANEOUS) ×3 IMPLANT
COVER LIGHT HANDLE STERIS (MISCELLANEOUS) ×6 IMPLANT
COVER MAYO STAND STRL (DRAPES) ×3 IMPLANT
DRAPE C-ARM XRAY 36X54 (DRAPES) ×3 IMPLANT
DRSG TEGADERM 4X4.75 (GAUZE/BANDAGES/DRESSINGS) ×3 IMPLANT
DRSG TELFA 4X3 1S NADH ST (GAUZE/BANDAGES/DRESSINGS) ×3 IMPLANT
ELECT REM PT RETURN 9FT ADLT (ELECTROSURGICAL) ×3
ELECTRODE REM PT RTRN 9FT ADLT (ELECTROSURGICAL) ×1 IMPLANT
GLOVE BIO SURGEON STRL SZ7.5 (GLOVE) ×3 IMPLANT
GLOVE BIO SURGEON STRL SZ8 (GLOVE) ×3 IMPLANT
GOWN STRL REUS W/ TWL LRG LVL3 (GOWN DISPOSABLE) ×1 IMPLANT
GOWN STRL REUS W/ TWL XL LVL3 (GOWN DISPOSABLE) ×1 IMPLANT
GOWN STRL REUS W/TWL LRG LVL3 (GOWN DISPOSABLE) ×2
GOWN STRL REUS W/TWL XL LVL3 (GOWN DISPOSABLE) ×2
IMMOBILIZER SHDR MD LX WHT (SOFTGOODS) IMPLANT
IMMOBILIZER SHDR XL LX WHT (SOFTGOODS) ×3 IMPLANT
INTRO PACEMKR SHEATH II 7FR (MISCELLANEOUS) ×3
INTRODUCER PACEMKR SHTH II 7FR (MISCELLANEOUS) ×1 IMPLANT
IPG PACE AZUR XT SR MRI W1SR01 (Pacemaker) ×1 IMPLANT
IV NS 500ML (IV SOLUTION) ×2
IV NS 500ML BAXH (IV SOLUTION) ×1 IMPLANT
KIT TURNOVER KIT A (KITS) ×3 IMPLANT
LABEL OR SOLS (LABEL) ×3 IMPLANT
LEAD CAPSURE NOVUS 5076-58CM (Lead) ×3 IMPLANT
MARKER SKIN DUAL TIP RULER LAB (MISCELLANEOUS) ×3 IMPLANT
PACE AZURE XT SR MRI W1SR01 (Pacemaker) ×3 IMPLANT
PACK PACE INSERTION (MISCELLANEOUS) ×3 IMPLANT
PAD ONESTEP ZOLL R SERIES ADT (MISCELLANEOUS) ×3 IMPLANT
SUT SILK 0 SH 30 (SUTURE) ×3 IMPLANT

## 2018-07-26 NOTE — Interval H&P Note (Signed)
History and Physical Interval Note:  07/26/2018 11:56 AM  [name]  has presented today for surgery, with the diagnosis of SICK SINUS SYNDROME,SYMPTOMATIC BRADYCARDIA  The various methods of treatment have been discussed with the patient and family. After consideration of risks, benefits and other options for treatment, the patient has consented to  Procedure(s): INSERTION PACEMAKER-SINGLE CHAMBER INITIAL INSERT (Left) as a surgical intervention .  The patient's history has been reviewed, patient examined, no change in status, stable for surgery.  I have reviewed the patient's chart and labs.  Questions were answered to the patient's satisfaction.     Kauan Kloosterman [employer]

## 2018-07-26 NOTE — Op Note (Signed)
[facility] Cardiology   07/26/2018                     1:10 PM  PATIENT:  [name]    PRE-OPERATIVE DIAGNOSIS:  SICK SINUS SYNDROME,SYMPTOMATIC BRADYCARDIA  POST-OPERATIVE DIAGNOSIS:  Same  PROCEDURE:  INSERTION PACEMAKER-SINGLE CHAMBER INITIAL INSERT  SURGEON:  Isaias Cowman, MD    ANESTHESIA:     PREOPERATIVE INDICATIONS:  [name] is a  84 y.o. male with a diagnosis of [place] who failed conservative measures and elected for surgical management.    The risks benefits and alternatives were discussed with the patient preoperatively including but not limited to the risks of infection, bleeding, cardiopulmonary complications, the need for revision surgery, among others, and the patient was willing to proceed.   OPERATIVE PROCEDURE: The patient was brought to the operating room fasting state.  The left pectoral region was prepped and draped in usual sterile manner.  Anesthesia was obtained 1% lidocaine locally.  A 6 cm incision was performed to the left pectoral region.  The pacemaker pocket was generated by letter cautery and blunt dissection.  Access was obtained to the left subclavian vein by fine-needle aspiration.  MRI compatible lead was positioned into the right ventricular apical septum ( Medtronic [id number] ).  After proper thresholds were obtained the lead was sutured in place.  The lead was connected to an MRI compatible single-chamber rate responsive pacemaker generator ( Medtronic [id number] ).  The pacemaker pocket was irrigated with gentamicin solution.  The pacemaker generator was positioned into the pocket and the pocket was closed with 2-0 and 4-0 Vicryl, respectively.  Steri-Strips and pressure dressing were applied.  There were no periprocedural complications.  Postprocedural interrogation revealed appropriate single-chamber sensing and pacing thresholds.

## 2018-07-26 NOTE — Anesthesia Preprocedure Evaluation (Addendum)
Anesthesia Evaluation  Patient identified by MRN, date of birth, ID band Patient awake    Reviewed: Allergy & Precautions, H&P , NPO status , Patient's Chart, lab work & pertinent test results  Airway Mallampati: III  TM Distance: >3 FB Neck ROM: full    Dental  (+) Teeth Intact, Missing   Pulmonary neg pulmonary ROS, former smoker,    breath sounds clear to auscultation       Cardiovascular hypertension, + CAD and + Peripheral Vascular Disease  negative cardio ROS   Rate:Normal     Neuro/Psych negative neurological ROS  negative psych ROS   GI/Hepatic negative GI ROS, Neg liver ROS, GERD  ,  Endo/Other  negative endocrine ROS  Renal/GU negative Renal ROS  negative genitourinary   Musculoskeletal   Abdominal   Peds  Hematology negative hematology ROS (+)   Anesthesia Other Findings Past Medical History: No date: Aneurysm (HCC) No date: Coronary artery disease No date: GERD (gastroesophageal reflux disease) No date: History of kidney stones No date: HLD (hyperlipidemia) No date: Hypertension  Past Surgical History: No date: APPENDECTOMY No date: CARDIAC CATHETERIZATION No date: CARDIAC SURGERY No date: CHOLECYSTECTOMY No date: CORONARY ANGIOPLASTY No date: CORONARY ARTERY BYPASS GRAFT No date: MASTOIDECTOMY No date: TONSILLECTOMY  BMI    Body Mass Index:  26.52 kg/m      Reproductive/Obstetrics negative OB ROS                            Anesthesia Physical Anesthesia Plan  ASA: III  Anesthesia Plan: General   Post-op Pain Management:    Induction:   PONV Risk Score and Plan: Propofol infusion  Airway Management Planned: Natural Airway and Nasal Cannula  Additional Equipment:   Intra-op Plan:   Post-operative Plan:   Informed Consent: I have reviewed the patients History and Physical, chart, labs and discussed the procedure including the risks, benefits and  alternatives for the proposed anesthesia with the patient or authorized representative who has indicated his/her understanding and acceptance.   Dental Advisory Given  Plan Discussed with: Anesthesiologist, CRNA and Surgeon  Anesthesia Plan Comments:        Anesthesia Quick Evaluation

## 2018-07-26 NOTE — Anesthesia Postprocedure Evaluation (Signed)
Anesthesia Post Note  Patient: [name]  Procedure(s) Performed: INSERTION PACEMAKER-SINGLE CHAMBER INITIAL INSERT (Left Chest)  Anesthesia Type: General     Last Vitals:  Vitals:   07/26/18 1420 07/26/18 1430  BP: (!) 148/66 (!) 142/55  Pulse: (!) 59 60  Resp: 19 19  Temp:    SpO2: 100% 100%    Last Pain:  Vitals:   07/26/18 1420  TempSrc:   PainSc: 0-No pain                 Durenda Hurt

## 2018-07-26 NOTE — Anesthesia Post-op Follow-up Note (Signed)
Anesthesia QCDR form completed.        

## 2018-07-26 NOTE — Transfer of Care (Signed)
Immediate Anesthesia Transfer of Care Note  Patient: [name]  Procedure(s) Performed: INSERTION PACEMAKER-SINGLE CHAMBER INITIAL INSERT (Left Chest)  Patient Location: PACU  Anesthesia Type:General  Level of Consciousness: awake and patient cooperative  Airway & Oxygen Therapy: Patient Spontanous Breathing and Patient connected to face mask oxygen  Post-op Assessment: Report given to RN and Post -op Vital signs reviewed and stable  Post vital signs: Reviewed and stable  Last Vitals:  Vitals Value Taken Time  BP 134/58 07/26/2018  1:11 PM  Temp 36.1 C 07/26/2018  1:10 PM  Pulse 61 07/26/2018  1:14 PM  Resp 16 07/26/2018  1:14 PM  SpO2 97 % 07/26/2018  1:14 PM  Vitals shown include unvalidated device data.  Last Pain:  Vitals:   07/26/18 1310  TempSrc:   PainSc: 0-No pain         Complications: No apparent anesthesia complications

## 2018-07-26 NOTE — H&P (Signed)
Jump to Section ? Document InformationEncounter DetailsLast Filed Vital SignsPatient ContactsPatient DemographicsPlan of TreatmentProgress NotesReason for VisitSocial HistoryVisit Diagnoses [name] Encounter Summary, generated on Aug. 20, 2019August 20, 2019 Printout Information  Document Contents Document Received Date Document Source Organization  Office Visit Aug. 20, 2019August 20, 2019 [facility]  Patient Demographics - 84 y.o. Male; born [birth year]   Patient Address Communication Language Race / Ethnicity Marital Status  [address] [place], [place] [address] [phone] [facility]) [phone] (Mobile) [name] English (Preferred) White / Not Hispanic or Latino Married  Reason for Visit   Reason Comments  Coronary Artery Disease myoview and echo today  Dizziness   Encounter Details   Date Type Department Care Team Description  07/04/2018 Office Visit [facility]  [place], [place] [address]  [phone]  Flossie Dibble, [place]  [facility]  [place], [place] [address]  [phone]  [phone] (Fax)  Bradycardia (Primary Dx);  Dizziness;  Coronary artery disease involving coronary bypass graft of native heart without angina pectoris;  Chronic a-fib (CMS-HCC)  Social History - documented as of this encounter  Tobacco Use Types Packs/Day Years Used Date  Former Smoker Cigarettes 1 85 Quit: 03/11/1973  Smokeless Tobacco: Never Used      Alcohol Use Drinks/Week oz/Week Comments  Yes   Occasionally 1-2 glasses of wine a week    Sex Assigned at [employer] Date Recorded  Not on file    Job Start Date Occupation Industry  Not on file Not on file Not on file   Travel History Travel Start Travel End  No recent travel history available.    Last Filed Vital Signs - documented in this encounter  Vital Sign Reading Time Taken Comments    Blood Pressure 124/60 07/04/2018 2:13 PM EDT   Pulse 60 07/04/2018 2:13 PM EDT   Temperature - -   Respiratory Rate - -   Oxygen Saturation - -   Inhaled Oxygen Concentration - -   Weight 90.7 kg (200 lb) 07/04/2018 2:13 PM EDT   Height 188 cm (6\' 2" ) 07/04/2018 2:13 PM EDT   Body Mass Index 25.68 07/04/2018 2:13 PM EDT   Progress Notes - documented in this encounter  Flossie Dibble, MD - 07/04/2018 2:15 PM EDT Formatting of this note might be different from the original. Established Patient Visit   Chief Complaint: Chief Complaint  Patient presents with  . Coronary Artery Disease  myoview and echo today  . Dizziness  Date of Service: 07/04/2018 Date of Birth: Apr 13, 1933 PCP: Valera Castle, MD  History of Present Illness: [name] is a 84 y.o.male patient  Dizziness The patient has had Acute dizziness over the last 4 weeks associated with moving from lying to sitting position and moving from sitting to standing position with variable relief. These symptoms appear to be worsening with increased severity and frequency. Other symptoms include confusion and dimming visionLightheaded and near syncope. Possible causes include arrhythmia  Holter The holter monitor shows occasional PVCs, PACs, atrial fibrillation, sick sinus syndrome and symptomatic bradycardia  Stress Test Exercise SESTAMIBI was performed showing Normal test. other than afib Hypertensive heart disease The patient has been on a medical regimen of antihypertensives for hypertensive heart disease as well as risk reduction of valve disease, atrial fibrillation, cardiomyopathy and vascular disease and is improved at this time and currently with chronic kidney disease, without congestive heart failure. We have discussed the  medical regimen and reasons for continuation. Currently the patient is without medication side effects. We have also had a discussion on primary and secondary effects of hypertension and  currently does not need changes in this medical regimen. Bradycardia The patient has a new problem of bradycardia diagnosed by pulse, ecg and holter. This is causing symptoms including weakness, fatigue, dizziness, syncope and/or pre-syncope, shortness of breath and slow pulse worsening with increased severity over the last 6 weeks. There is a differential diagnosis of sick sinus syndrome and concerns for sick sinus syndrome. We have discussed diagnosis and treatment options. There is consideration for pacer placement  **   Results for orders placed or performed in visit on 07/04/18  Echo complete  Result Value Ref Range  LV Ejection Fraction (%) 45  Aortic Valve Stenosis Grade none  Aortic Valve Regurgitation Grade mild  Aortic Valve Max Velocity (m/s) 1.8 m/sec  Aortic Valve Stenosis Mean Gradient (mmHg) 4.8 mmHg  Mitral Valve Stenosis Grade none  Mitral Valve Regurgitation Grade moderate  Tricuspid Valve Regurgitation Grade moderate  Tricuspid Valve Regurgitation Max Velocity (m/s) 2.8 m/sec  Right Ventricle Systolic Pressure (mmHg) 99.3 mmHg  LV End Diastolic Diameter (cm) 5.1 cm  LV End Systolic Diameter (cm) 3.9 cm  LV Septum Wall Thickness (cm) 1.1 cm  LV Posterior Wall Thickness (cm) 0.99 cm  Left Atrium Diameter (cm) 4.5 cm  Narrative  CARDIOLOGY DEPARTMENT [name], [name] [id number] [facility] Acct #: [id number] [address] [place], [place] [address] Date: 07/04/2018 08: 06 AM Adult Male Age: 84 yrs ECHOCARDIOGRAM REPORT Outpatient KC^^KCWC STUDY:CHEST WALL TAPE:0000: 00: 0: 00: 00 MD1: MELVILLE, BONNIE JEAN ECHO:Yes DOPPLER:Yes FILE:0000-000-000 BP: 120/70 mmHg COLOR:Yes CONTRAST:No MACHINE:Philips RV BIOPSY:No 3D:No SOUND QLTY:Moderate Height: 74 in MEDIUM:None Weight: 201 lb BSA: 2.2 m2 _________________________________________________________________________________________ HISTORY: DOE REASON: Assess, LV  function _________________________________________________________________________________________ ECHOCARDIOGRAPHIC MEASUREMENTS 2D DIMENSIONS AORTA Values Normal Range MAIN PA Values Normal Range Annulus: 1.8 cm [2.3-2.9] PA Main: nm* [1.5-2.1] Aorta Sin: 3.0 cm [3.1-3.7] RIGHT VENTRICLE ST Junction: nm* [2.6-3.2] RV Base: nm* [<4.2] Asc.Aorta: nm* [2.6-3.4] RV Mid: nm* [<3.5] LEFT VENTRICLE RV Length: 3.0 cm [<8.6] LVIDd: 5.1 cm [4.2-5.9] INFERIOR VENA CAVA LVIDs: 3.9 cm Max. IVC: nm* [<=2.1] FS: 22.2 % [>25] Min. IVC: nm* SWT: 1.1 cm [0.6-1.0] ------------------ PWT: 0.99 cm [0.6-1.0] nm* - not measured LEFT ATRIUM LA Diam: 4.5 cm [3.0-4.0] LA A4C Area: nm* [<20] LA Volume: nm* [18-58] _________________________________________________________________________________________ ECHOCARDIOGRAPHIC DESCRIPTIONS AORTIC ROOT Size: Normal Dissection: INDETERM FOR DISSECTION AORTIC VALVE Leaflets: Tricuspid Morphology: MILDLY THICKENED Mobility: Fully mobile LEFT VENTRICLE Size: Normal Anterior: Normal Contraction: REGIONALLY IMPAIRED Lateral: Normal Closest EF: 45% (Estimated) Septal: HYPOCONTRACTILE LV Masses: No Masses Apical: HYPOCONTRACTILE LVH: None Inferior: Normal Posterior: Normal Dias.FxClass: N/A MITRAL VALVE Leaflets: Normal Mobility: Fully mobile Morphology: Normal LEFT ATRIUM Size: MODERATELY ENLARGED LA Masses: No masses IA Septum: Normal IAS MAIN PA Size: Normal PULMONIC VALVE Morphology: Normal Mobility: Fully mobile RIGHT VENTRICLE RV Masses: No Masses Size: Normal Free Wall: Normal Contraction: MILD GLOBAL DECREASE TRICUSPID VALVE Leaflets: Normal Mobility: Fully mobile Morphology: Normal RIGHT ATRIUM Size: MODERATELY ENLARGED RA Other: None RA Mass: No masses PERICARDIUM Fluid: No effusion INFERIOR VENACAVA Size: Not seen Not Seen _________________________________________________________________________________________  DOPPLER ECHO and OTHER  SPECIAL PROCEDURES Aortic: MILD AR No AS 179.5 cm/sec peak vel 12.9 mmHg peak grad 4.8 mmHg mean grad Mitral: MODERATE MR No MS MV Inflow E Vel = 95.6 cm/sec MV Annulus E'Vel = 11.3 cm/sec E/E'Ratio = 8.5 Tricuspid:  MODERATE TR No TS 276.0 cm/sec peak TR vel 40.5 mmHg peak RV pressure Pulmonary: MILD PR No PS 95.0 cm/sec peak vel 3.6 mmHg peak grad _________________________________________________________________________________________ INTERPRETATION MILD LV SYSTOLIC DYSFUNCTION (See above) MILD RV SYSTOLIC DYSFUNCTION (See above) MODERATE VALVULAR REGURGITATION (See above) NO VALVULAR STENOSIS MODERATE MR, TR MILD PHTN MILD AR, PR EF 45% _________________________________________________________________________________________ Electronically signed by MD Serafina Royals on 07/04/2018 10: 109 AM Performed By: Maurilio Lovely, RDCS Ordering Physician: Etta Quill _________________________________________________________________________________________   Past Medical and Surgical History  Past Medical History Past Medical History:  Diagnosis Date  . AAA (abdominal aortic aneurysm) (CMS-HCC)  4.5 cm (October, 2010)  . Anxiety  . Central retinal artery occlusion of right eye  . Coronary artery disease  Status post CABG x4 1995 with multiple stents after and mild MI. Cardiac cath and minimal subendocardial MI 08/2012 with significant stenosis of graft to diagonal artery not amenable to PCI and stent placement.  Marland Kitchen GERD (gastroesophageal reflux disease)  . History of nephrolithiasis  . Hyperlipidemia  . Hypertension  . Insomnia  . Thoracic aortic aneurysm (CMS-HCC)   Past Surgical History He has a past surgical history that includes Coronary artery bypass graft (1995); Appendectomy (1959); Cholecystectomy (01/1995); Cardiac catheterization (September 2013); colonoscopy; Tonsillectomy & Adenoidectomy; and PTCI - cardiac.   Medications and Allergies  Current  Medications  Current Outpatient Medications on File Prior to Visit  Medication Sig Dispense Refill  . amLODIPine-benazepril (LOTREL) 5-20 mg capsule TAKE 1 CAPSULE EVERY DAY 90 capsule 2  . aspirin 81 MG EC tablet Take 1 tablet (81 mg total) by mouth once daily 90 tablet 3  . atorvastatin (LIPITOR) 20 MG tablet Take 1 tablet (20 mg total) by mouth nightly 90 tablet 3  . isosorbide mononitrate (IMDUR) 30 MG ER tablet Take 1 tablet (30 mg total) by mouth once daily 90 tablet 3  . meloxicam (MOBIC) 7.5 MG tablet Take 1 tablet (7.5 mg total) by mouth once daily 30 tablet 2  . neomycin-polymyxin-dexamethasone (MAXITROL) ophthalmic suspension Place 1 drop into both eyes as needed  . ranitidine (ZANTAC) 150 MG capsule Take 1 capsule (150 mg total) by mouth 2 (two) times daily 180 capsule 3   No current facility-administered medications on file prior to visit.   Allergies: Shellfish containing products; Penicillins; and Septra [sulfamethoxazole-trimethoprim]  Social and Family History  Social History reports that he quit smoking about 45 years ago. His smoking use included cigarettes. He has a 20.00 pack-year smoking history. He has never used smokeless tobacco. He reports that he drinks alcohol. He reports that he does not use drugs.  Family History Family History  Problem Relation Age of Onset  . Coronary Artery Disease (Blocked arteries around heart) Father  . Heart failure Father  . Heart failure Mother  . Anesthesia problems Neg Hx  . Malignant hypertension Neg Hx   Review of Systems   Review of Systems  Positive for dizzines near syncope Negative for weight gain weight loss, weakness, vision change, hearing loss, cough, congestion, PND, orthopnea, heartburn, nausea, diaphoresis, vomiting, diarrhea, bloody stool, melena, stomach pain, extremity pain, leg weakness, leg cramping, leg blood clots, headache nosebleed, trouble swallowing, mouth pain, urinary frequency, urination at night,  skin lesions, skin rashes, tingling ,ulcers, numbness, anxiety, and/or depression Physical Examination   Vitals:BP 124/60  Pulse 60  Ht 188 cm (6\' 2" )  Wt 90.7 kg (200 lb)  BMI 25.68 kg/m  Ht:188 cm (6\' 2" ) Wt:90.7 kg (200 lb) WVP:XTGG surface area  is 2.18 meters squared. Body mass index is 25.68 kg/m. Appearance: well appearing in no acute distress HEENT: Pupils equally reactive to light and accomodation, no xanthalasma  Neck: Supple, no apparent thyromegaly, masses, or lymphadenopathy  Lungs: normal respiratory effort; no crackles, no rhonchi, no wheezes Heart: irregular rate and rhythm. Normal S1 S2 No gallops, murmur, no rub, PMI is normal size and placement. carotid upstroke normal without bruit. Jugular venous pressure is normal Abdomen: soft, nontender, not distended with normal bowel sounds. No apparent hepatosplenomegally. Abdominal aorta is normal size without bruit Extremities: trace edema, no ulcers, no clubbing, no cyanosis Peripheral Pulses: 2+ in upper extremities, 2+ femoral pulses bilaterally, 2+lower extremity  Musculoskeletal; Normal muscle tone without kyphosis Neurological: Oriented and Alert, Cranial nerves intact  Assessment   84 y.o. male with  Encounter Diagnoses  Name Primary?  . Bradycardia Yes  . Dizziness  . Coronary artery disease involving coronary bypass graft of native heart without angina pectoris  . Chronic a-fib (CMS-HCC)   Plan   -The patient is to have consultation and permanent pacemaker placement for sick sinus syndrome and symptomatic bradycardia. The patient understands all risks and benefits of permanent pacemaker placement. This includes the possibility of death, stroke, heart attack, hemopericardium, pneumothorax, infection, bleeding, blood clot, and reaction to medications. The patient is at low risk for general anesthesia -There has been a discussion of the current guidelines for hypertension control. We will continue current medical  regimen for hypertension control which will also help in risk factor modification of cardiovascular disease. The patient understands and agrees with the current plan. We will be watching for possible future side effects of these medications. Additional home blood pressure monitoring is recommended if able.  No orders of the defined types were placed in this encounter.  No follow-ups on file.  Flossie Dibble, MD    Electronically signed by Flossie Dibble, MD at 07/04/2018 2:30 PM EDT  Plan of Treatment - documented as of this encounter  Upcoming Encounters Upcoming Encounters  Date Type Specialty Care Team Description  08/09/2018 Office Visit Family Medicine Valera Castle, MD  [address]  [place], [place] [address]  [phone]  [phone] (Fax)    Visit Diagnoses - documented in this encounter  Diagnosis  Bradycardia - Primary  Other specified cardiac dysrhythmias   Dizziness  Dizziness and giddiness   Coronary artery disease involving coronary bypass graft of native heart without angina pectoris   Chronic a-fib (CMS-HCC)  Atrial fibrillation   Images Patient Contacts   Contact Name Contact Address Communication Relationship to Patient  [name] [address] [place], [place] [address] [phone] (Home) Spouse, Emergency Contact  Document Information  Primary Care Provider Other Service Providers Document Coverage Dates  Valera Castle, MD (Nov. 05, 2012November 05, 2012 - Present) DM: [id number] [phone] (Work) [phone] (Fax) [place], [place] [address]  Jul. 29, 2019July 29, 2019 - Aug. 02, 2019August 02, 2019   [place], [place] [address]   Encounter Providers Encounter Date  Flossie Dibble, MD (Attending) [phone] (Work) [phone] (Fax) [facility] [place], [place] [address] Cardiovascular  Disease Jul. 29, 2019July 29, 2019 - Aug. 02, 2019August 02, 2019    Show All Sections

## 2018-07-26 NOTE — Anesthesia Postprocedure Evaluation (Signed)
Anesthesia Post Note  Patient: [name]  Procedure(s) Performed: INSERTION PACEMAKER-SINGLE CHAMBER INITIAL INSERT (Left Chest)  Anesthesia Post Evaluation   Last Vitals:  Vitals:   07/26/18 1420 07/26/18 1430  BP: (!) 148/66 (!) 142/55  Pulse: (!) 59 60  Resp: 19 19  Temp:    SpO2: 100% 100%    Last Pain:  Vitals:   07/26/18 1420  TempSrc:   PainSc: 0-No pain                 Durenda Hurt

## 2018-07-26 NOTE — Progress Notes (Signed)
Pt resting in bed. Immobilizer remains intact to left arm to prevent movement. Pt reminded to restrict left arm movement. Dressing left chest intact with no new drainage noted. Pt denies pain or discomfort.

## 2018-07-27 ENCOUNTER — Encounter: Payer: Self-pay | Admitting: Cardiology

## 2018-07-27 DIAGNOSIS — I495 Sick sinus syndrome: Secondary | ICD-10-CM | POA: Diagnosis not present

## 2018-07-27 MED ORDER — CLARITHROMYCIN 250 MG PO TABS: 250.0000 mg | ORAL_TABLET | Freq: Two times a day (BID) | ORAL | 0 refills | Status: AC

## 2018-07-27 NOTE — Discharge Summary (Signed)
Physician Discharge Summary  Patient ID: [name] MRN: [id number] DOB/AGE: Oct 26, 1933 84 y.o.  Admit date: 07/26/2018 Discharge date: 07/27/2018  Primary Discharge Diagnosis Sick sinus syndrome, symptomatic bradycardia Secondary Discharge Diagnosis same  Significant Diagnostic Studies: no  Consults: None  Hospital Course: 84 year old male with a diagnosis of sick sinus syndrome and symptomatic bradycardia who failed conservative measures and elected for surgical management who underwent successful single chamber pacemaker implantation on 07/26/2018 without perioperative complications. The patient has ambulated this morning without difficulty. He denies significant pain to the incision site or active bleeding. ECG reveals atrial fibrillation with frequent ventricular paced complexes, rate 66 bpm. Chest xray negative for pneumothorax.   Discharge Exam: Blood pressure (!) 177/75, pulse 71, temperature 97.7 F (36.5 C), resp. rate 18, height 6\' 1"  (1.854 m), weight 91.2 kg, SpO2 100 %.   General appearance: alert, cooperative, appears stated age and no distress Head: Normocephalic, without obvious abnormality, atraumatic Eyes: negative Back: no kyphosis present Cardio: regular rate and rhythm, S1, S2 normal, no murmur, click, rub or gallop Extremities: extremities normal, atraumatic, no cyanosis or edema Incision/Wound:tegaderm intact with scant amount of dry brown blood, no surrounding erythema or warmth Labs:   Lab Results  Component Value Date   WBC 7.4 07/18/2018   HGB 15.7 07/18/2018   HCT 43.2 07/18/2018   MCV 92.5 07/18/2018   PLT 158 07/18/2018   No results for input(s): NA, K, CL, CO2, BUN, CREATININE, CALCIUM, PROT, BILITOT, ALKPHOS, ALT, AST, GLUCOSE in the last 168 hours.  Invalid input(s): LABALBU    Radiology: Chest xray negative for pneumothorax, no acute cardiopulmonary abnormality or adverse features status post pacemaker placement EKG: Atrial  fibrillation with frequent ventricular paced complexes, rate 66 bpm  FOLLOW UP PLANS AND APPOINTMENTS  Allergies as of 07/27/2018      Reactions   Crab [shellfish Allergy] Swelling   Lips/fingers--whole body swelling (caused hospitalized) CRABS ONLY   Penicillins Itching, Swelling, Other (See Comments)   Has patient had a PCN reaction causing immediate rash, facial/tongue/throat swelling, SOB or lightheadedness with hypotension: Yes Has patient had a PCN reaction causing severe rash involving mucus membranes or skin necrosis: No Has patient had a PCN reaction that required hospitalization No Has patient had a PCN reaction occurring within the last 10 years: No If all of the above answers are "NO", then may proceed with Cephalosporin use.      Medication List    STOP taking these medications   HYDROcodone-acetaminophen 5-325 MG tablet Commonly known as:  NORCO/VICODIN   meloxicam 7.5 MG tablet Commonly known as:  MOBIC     TAKE these medications   acetaminophen 325 MG tablet Commonly known as:  TYLENOL Take 650 mg by mouth every 6 (six) hours as needed.   amLODipine-benazepril 5-20 MG capsule Commonly known as:  LOTREL Take 1 capsule by mouth daily.   aspirin EC 81 MG tablet Take 81 mg by mouth daily.   atorvastatin 20 MG tablet Commonly known as:  LIPITOR Take 20 mg by mouth at bedtime.   clarithromycin 250 MG tablet Commonly known as:  BIAXIN Take 1 tablet (250 mg total) by mouth 2 (two) times daily for 7 days.   isosorbide mononitrate 30 MG 24 hr tablet Commonly known as:  IMDUR Take 30 mg by mouth daily.   neomycin-polymyxin-dexamethasone 0.1 % ophthalmic suspension Commonly known as:  MAXITROL Place 1 drop into both eyes 3 (three) times daily as needed (for burning eyes.).  ranitidine 150 MG capsule Commonly known as:  ZANTAC Take 150 mg by mouth 2 (two) times daily.   vitamin B-12 500 MCG tablet Commonly known as:  CYANOCOBALAMIN Take 500 mcg by mouth  daily.      Follow-up Information    Corey Skains, MD. Go in 1 week(s).   Specialty:  Cardiology Contact information: [facility] [place] [address] [phone]           BRING ALL MEDICATIONS WITH YOU TO FOLLOW UP APPOINTMENTS  Time spent with patient to include physician time: 25 Signed:  Clabe Seal PA-C 07/27/2018, 8:30 AM

## 2018-07-27 NOTE — Discharge Instructions (Signed)
For 6 weeks, avoid lifting greater than 15 pounds or raising your left arm above your head. You may shower in 24 hours, but avoid direct contact with the shower head to incision site. You may take off the exterior bandage tomorrow, 07/28/18. Leave steri-strips alone. They may fall off on their own or will be removed at your follow-up appointment. You will receive a [employer] box for remote interrogation of your pacemaker. Plug it in to an outlet in the room where you sleep, within 10 feet of where you sleep, and turn it on. You do not need to take this with you to your appointment to see Dr. Nehemiah Massed. You will learn more about this box at your follow-up appointment.

## 2018-07-27 NOTE — Plan of Care (Signed)
  Problem: Activity: Goal: Risk for activity intolerance will decrease Outcome: Progressing Note:  Up to  chair and bathroom   Problem: Elimination: Goal: Will not experience complications related to urinary retention Outcome: Progressing   Problem: Pain Managment: Goal: General experience of comfort will improve Outcome: Progressing   Problem: Safety: Goal: Ability to remain free from injury will improve Outcome: Progressing   Problem: Education: Goal: Knowledge of General Education information will improve Description Including pain rating scale, medication(s)/side effects and non-pharmacologic comfort measures Outcome: Completed/Met

## 2018-08-16 ENCOUNTER — Ambulatory Visit (INDEPENDENT_AMBULATORY_CARE_PROVIDER_SITE_OTHER): Payer: Medicare Other

## 2018-08-16 ENCOUNTER — Encounter (INDEPENDENT_AMBULATORY_CARE_PROVIDER_SITE_OTHER): Payer: Self-pay | Admitting: Nurse Practitioner

## 2018-08-16 ENCOUNTER — Ambulatory Visit (INDEPENDENT_AMBULATORY_CARE_PROVIDER_SITE_OTHER): Payer: Medicare Other | Admitting: Nurse Practitioner

## 2018-08-16 VITALS — BP 135/70 | HR 77 | Resp 16 | Wt 199.4 lb

## 2018-08-16 DIAGNOSIS — I10 Essential (primary) hypertension: Secondary | ICD-10-CM | POA: Diagnosis not present

## 2018-08-16 DIAGNOSIS — E782 Mixed hyperlipidemia: Secondary | ICD-10-CM

## 2018-08-16 DIAGNOSIS — I714 Abdominal aortic aneurysm, without rupture, unspecified: Secondary | ICD-10-CM

## 2018-08-16 DIAGNOSIS — M5136 Other intervertebral disc degeneration, lumbar region: Secondary | ICD-10-CM | POA: Diagnosis not present

## 2018-08-16 NOTE — Progress Notes (Signed)
Subjective:    Patient ID: [name], male    DOB: 1933-04-16, 84 y.o.   MRN: [id number] Chief Complaint  Patient presents with  . Follow-up    23yr EVAR    HPI  [name] is a 84 y.o. male who returns to the office for surveillance of an abdominal aortic aneurysm status post stent graft placement on 04/15/2012.   Patient denies abdominal pain or back pain, no other abdominal complaints. No groin related complaints. No symptoms consistent with distal embolization No changes in claudication distance.   The patient reports having a pacemaker placed 3 weeks ago, and he feels much better since it's placement.  Patient denies amaurosis fugax or TIA symptoms. There is no history of claudication or rest pain symptoms of the lower extremities. The patient denies angina or shortness of breath.   Duplex US of the aorta and iliac arteries shows a 3.0 AAA sac with no evidence endoleak, the previous sac size was 3.8.  Constitutional: [] Weight loss  [] Fever  [] Chills Cardiac: [] Chest pain   [] Chest pressure   [] Palpitations   [] Shortness of breath when laying flat   [] Shortness of breath with exertion. Vascular:  [] Pain in legs with walking   [] Pain in legs with standing  [] History of DVT   [] Phlebitis   [] Swelling in legs   [] Varicose veins   [] Non-healing ulcers Pulmonary:   [] Uses home oxygen   [] Productive cough   [] Hemoptysis   [] Wheeze  [] COPD   [] Asthma Neurologic:  [] Dizziness   [] Seizures   [] History of stroke   [] History of TIA  [] Aphasia   [] Vissual changes   [] Weakness or numbness in arm   [] Weakness or numbness in leg Musculoskeletal:   [] Joint swelling   [] Joint pain   [] Low back pain Hematologic:  [] Easy bruising  [] Easy bleeding   [] Hypercoagulable state   [] Anemic Gastrointestinal:  [] Diarrhea   [] Vomiting  [] Gastroesophageal reflux/heartburn   [] Difficulty swallowing. Genitourinary:  [] Chronic kidney disease   [] Difficult urination  [] Frequent urination   [] Blood in  urine Skin:  [] Rashes   [] Ulcers  Psychological:  [] History of anxiety   []  History of major depression.     Objective:   Physical Exam  BP 135/70 (BP Location: Right Arm)   Pulse 77   Resp 16   Wt 199 lb 6.4 oz (90.4 kg)   BMI 26.31 kg/m   Past Medical History:  Diagnosis Date  . Aneurysm ([place])   . Coronary artery disease   . GERD (gastroesophageal reflux disease)   . History of kidney stones   . HLD (hyperlipidemia)   . Hypertension      Gen: WD/WN, NAD Head: [place]/AT, No temporalis wasting.  Ear/Nose/Throat: Hearing grossly intact, nares w/o erythema or drainage Eyes: PER, EOMI, sclera nonicteric.  Neck: Supple, no masses.  No JVD.  Pulmonary:  Good air movement, no use of accessory muscles.  Cardiac: RRR Vascular:  Vessel Right Left  Radial 2+ 2+  Gastrointestinal: soft, non-distended. No guarding/no peritoneal signs.  Musculoskeletal: M/S 5/5 throughout.  No deformity or atrophy.  Neurologic: Pain and light touch intact in extremities.  Symmetrical.  Speech is fluent. Motor exam as listed above. Psychiatric: Judgment intact, Mood & affect appropriate for pt's clinical situation. Dermatologic: No Venous rashes. No Ulcers Noted.  No changes consistent with cellulitis. Lymph : No Cervical lymphadenopathy, no lichenification or skin changes of chronic lymphedema.   Social History   Socioeconomic History  . Marital status: Married  Spouse name: Not on file  . Number of children: Not on file  . Years of education: Not on file  . Highest education level: Not on file  Occupational History  . Not on file  Social Needs  . Financial resource strain: Not on file  . Food insecurity:    Worry: Not on file    Inability: Not on file  . Transportation needs:    Medical: Not on file    Non-medical: Not on file  Tobacco Use  . Smoking status: Former Smoker    Types: Cigarettes  . Smokeless tobacco: Never Used  Substance and Sexual Activity  . Alcohol use: Yes     Comment: wine occas.   . Drug use: No  . Sexual activity: Not on file  Lifestyle  . Physical activity:    Days per week: Not on file    Minutes per session: Not on file  . Stress: Not on file  Relationships  . Social connections:    Talks on phone: Not on file    Gets together: Not on file    Attends religious service: Not on file    Active member of club or organization: Not on file    Attends meetings of clubs or organizations: Not on file    Relationship status: Not on file  . Intimate partner violence:    Fear of current or ex partner: Not on file    Emotionally abused: Not on file    Physically abused: Not on file    Forced sexual activity: Not on file  Other Topics Concern  . Not on file  Social History Narrative  . Not on file    Past Surgical History:  Procedure Laterality Date  . APPENDECTOMY    . CARDIAC CATHETERIZATION    . CARDIAC SURGERY    . CHOLECYSTECTOMY    . CORONARY ANGIOPLASTY    . CORONARY ARTERY BYPASS GRAFT    . MASTOIDECTOMY    . PACEMAKER INSERTION Left 07/26/2018   Procedure: INSERTION PACEMAKER-SINGLE CHAMBER INITIAL INSERT;  Surgeon: Isaias Cowman, MD;  Location: [facility] ORS;  Service: Cardiovascular;  Laterality: Left;  . TONSILLECTOMY      Family History  Problem Relation Age of Onset  . Cancer Mother   . Heart disease Mother   . Heart attack Father   . Heart disease Father     Allergies  Allergen Reactions  . Crab [Shellfish Allergy] Swelling    Lips/fingers--whole body swelling (caused hospitalized) CRABS ONLY  . Penicillins Itching, Swelling and Other (See Comments)    Has patient had a PCN reaction causing immediate rash, facial/tongue/throat swelling, SOB or lightheadedness with hypotension: Yes Has patient had a PCN reaction causing severe rash involving mucus membranes or skin necrosis: No Has patient had a PCN reaction that required hospitalization No Has patient had a PCN reaction occurring within the last 10 years:  No If all of the above answers are "NO", then may proceed with Cephalosporin use.       Assessment & Plan:   1. AAA (abdominal aortic aneurysm) without rupture (HCC) Recommend: Patient is status post successful endovascular repair of the AAA.   No further intervention is required at this time.   No endoleak is detected and the aneurysm sac is stable.  The patient will continue antiplatelet therapy as prescribed as well as aggressive management of hyperlipidemia. Exercise is again strongly encouraged.   However, endografts require continued surveillance with ultrasound or CT scan. This is  mandatory to detect any changes that allow repressurization of the aneurysm sac.  The patient is informed that this would be asymptomatic.  The patient is reminded that lifelong routine surveillance is a necessity with an endograft. Patient will continue to follow-up at 12 month intervals with ultrasound of the aorta.  - VAS [place] EVAR DUPLEX; Future  2. Essential hypertension Continue antihypertensive medications as already ordered, these medications have been reviewed and there are no changes at this time.   3. Mixed hyperlipidemia Continue statin as ordered and reviewed, no changes at this time   4. Disc degeneration, lumbar Continue NSAID medications as already ordered, these medications have been reviewed and there are no changes at this time.  Continued activity and therapy was stressed.      Current Outpatient Medications on File Prior to Visit  Medication Sig Dispense Refill  . acetaminophen (TYLENOL) 325 MG tablet Take 650 mg by mouth every 6 (six) hours as needed.    Marland Kitchen amLODipine-benazepril (LOTREL) 5-20 MG capsule Take 1 capsule by mouth daily.    Marland Kitchen aspirin EC 81 MG tablet Take 81 mg by mouth daily.    Marland Kitchen atorvastatin (LIPITOR) 20 MG tablet Take 20 mg by mouth at bedtime.     . isosorbide mononitrate (IMDUR) 30 MG 24 hr tablet Take 30 mg by mouth daily.     Marland Kitchen  neomycin-polymyxin-dexamethasone (MAXITROL) 0.1 % ophthalmic suspension Place 1 drop into both eyes 3 (three) times daily as needed (for burning eyes.).    Marland Kitchen ranitidine (ZANTAC) 150 MG capsule Take 150 mg by mouth 2 (two) times daily.     . vitamin B-12 (CYANOCOBALAMIN) 500 MCG tablet Take 500 mcg by mouth daily.     No current facility-administered medications on file prior to visit.     There are no Patient Instructions on file for this visit. No follow-ups on file.   Kris Hartmann, NP

## 2018-12-28 ENCOUNTER — Encounter: Payer: Self-pay | Admitting: Emergency Medicine

## 2018-12-28 ENCOUNTER — Other Ambulatory Visit: Payer: Self-pay

## 2018-12-28 ENCOUNTER — Observation Stay
Admission: EM | Admit: 2018-12-28 | Discharge: 2018-12-28 | Disposition: A | Discharge status: Home / Self Care | Payer: Medicare Other | Attending: Internal Medicine | Admitting: Internal Medicine

## 2018-12-28 ENCOUNTER — Emergency Department: Payer: Medicare Other

## 2018-12-28 DIAGNOSIS — Z951 Presence of aortocoronary bypass graft: Secondary | ICD-10-CM | POA: Diagnosis not present

## 2018-12-28 DIAGNOSIS — Z7982 Long term (current) use of aspirin: Secondary | ICD-10-CM | POA: Insufficient documentation

## 2018-12-28 DIAGNOSIS — I2510 Atherosclerotic heart disease of native coronary artery without angina pectoris: Secondary | ICD-10-CM | POA: Diagnosis not present

## 2018-12-28 DIAGNOSIS — I495 Sick sinus syndrome: Secondary | ICD-10-CM | POA: Insufficient documentation

## 2018-12-28 DIAGNOSIS — I2089 Other forms of angina pectoris: Secondary | ICD-10-CM | POA: Diagnosis present

## 2018-12-28 DIAGNOSIS — M5136 Other intervertebral disc degeneration, lumbar region: Secondary | ICD-10-CM | POA: Insufficient documentation

## 2018-12-28 DIAGNOSIS — E782 Mixed hyperlipidemia: Secondary | ICD-10-CM | POA: Diagnosis not present

## 2018-12-28 DIAGNOSIS — R079 Chest pain, unspecified: Secondary | ICD-10-CM | POA: Diagnosis present

## 2018-12-28 DIAGNOSIS — K219 Gastro-esophageal reflux disease without esophagitis: Secondary | ICD-10-CM | POA: Diagnosis not present

## 2018-12-28 DIAGNOSIS — Z79899 Other long term (current) drug therapy: Secondary | ICD-10-CM | POA: Insufficient documentation

## 2018-12-28 DIAGNOSIS — Z9049 Acquired absence of other specified parts of digestive tract: Secondary | ICD-10-CM | POA: Insufficient documentation

## 2018-12-28 DIAGNOSIS — I208 Other forms of angina pectoris: Secondary | ICD-10-CM | POA: Diagnosis present

## 2018-12-28 DIAGNOSIS — Z8249 Family history of ischemic heart disease and other diseases of the circulatory system: Secondary | ICD-10-CM | POA: Diagnosis not present

## 2018-12-28 DIAGNOSIS — Z87442 Personal history of urinary calculi: Secondary | ICD-10-CM | POA: Insufficient documentation

## 2018-12-28 DIAGNOSIS — I480 Paroxysmal atrial fibrillation: Secondary | ICD-10-CM | POA: Diagnosis not present

## 2018-12-28 DIAGNOSIS — I714 Abdominal aortic aneurysm, without rupture: Secondary | ICD-10-CM | POA: Diagnosis not present

## 2018-12-28 DIAGNOSIS — Z950 Presence of cardiac pacemaker: Secondary | ICD-10-CM | POA: Insufficient documentation

## 2018-12-28 DIAGNOSIS — Z87891 Personal history of nicotine dependence: Secondary | ICD-10-CM | POA: Insufficient documentation

## 2018-12-28 DIAGNOSIS — I10 Essential (primary) hypertension: Secondary | ICD-10-CM | POA: Diagnosis not present

## 2018-12-28 LAB — COMPREHENSIVE METABOLIC PANEL
ALBUMIN: 4.6 g/dL (ref 3.5–5.0)
ALT: 20 U/L (ref 0–44)
ANION GAP: 11 (ref 5–15)
AST: 33 U/L (ref 15–41)
Alkaline Phosphatase: 94 U/L (ref 38–126)
BUN: 15 mg/dL (ref 8–23)
CHLORIDE: 102 mmol/L (ref 98–111)
CO2: 28 mmol/L (ref 22–32)
Calcium: 10 mg/dL (ref 8.9–10.3)
Creatinine, Ser: 1.06 mg/dL (ref 0.61–1.24)
GFR calc Af Amer: >60 mL/min (ref >60)
GFR calc non Af Amer: >60 mL/min (ref >60)
GLUCOSE: 116 mg/dL — AB (ref 70–99)
Potassium: 3.9 mmol/L (ref 3.5–5.1)
SODIUM: 141 mmol/L (ref 135–145)
Total Bilirubin: 2.7 mg/dL — ABNORMAL HIGH (ref 0.3–1.2)
Total Protein: 8.1 g/dL (ref 6.5–8.1)

## 2018-12-28 LAB — CBC WITH DIFFERENTIAL/PLATELET
ABS IMMATURE GRANULOCYTES: 0.03 10*3/uL (ref 0.00–0.07)
BASOS ABS: 0.1 10*3/uL (ref 0.0–0.1)
BASOS PCT: 1 %
Eosinophils Absolute: 0.3 10*3/uL (ref 0.0–0.5)
Eosinophils Relative: 4 %
HCT: 46.4 % (ref 39.0–52.0)
HEMOGLOBIN: 16.2 g/dL (ref 13.0–17.0)
IMMATURE GRANULOCYTES: 0 %
LYMPHS PCT: 22 %
Lymphs Abs: 2.1 10*3/uL (ref 0.7–4.0)
MCH: 32.3 pg (ref 26.0–34.0)
MCHC: 34.9 g/dL (ref 30.0–36.0)
MCV: 92.4 fL (ref 80.0–100.0)
Monocytes Absolute: 0.8 10*3/uL (ref 0.1–1.0)
Monocytes Relative: 9 %
NEUTROS ABS: 5.9 10*3/uL (ref 1.7–7.7)
NEUTROS PCT: 64 %
PLATELETS: 199 10*3/uL (ref 150–400)
RBC: 5.02 MIL/uL (ref 4.22–5.81)
RDW: 12.7 % (ref 11.5–15.5)
WBC: 9.2 10*3/uL (ref 4.0–10.5)
nRBC: 0 % (ref 0.0–0.2)

## 2018-12-28 LAB — TROPONIN I
Troponin I: <0.03 ng/mL (ref <0.03)
Troponin I: <0.03 ng/mL (ref <0.03)

## 2018-12-28 LAB — CK: Total CK: 36 U/L — ABNORMAL LOW (ref 49–397)

## 2018-12-28 LAB — BRAIN NATRIURETIC PEPTIDE: B Natriuretic Peptide: 104 pg/mL — ABNORMAL HIGH (ref 0.0–100.0)

## 2018-12-28 LAB — LIPASE, BLOOD: LIPASE: 25 U/L (ref 11–51)

## 2018-12-28 MED ORDER — SODIUM CHLORIDE 0.9% FLUSH
3.0000 mL | Freq: Two times a day (BID) | INTRAVENOUS | Status: DC
  Administered 2018-12-28: 3 mL via INTRAVENOUS

## 2018-12-28 MED ORDER — SUCRALFATE 1 G PO TABS
1.0000 g | ORAL_TABLET | Freq: Once | ORAL | Status: DC
  Filled 2018-12-28: qty 1

## 2018-12-28 MED ORDER — KETOROLAC TROMETHAMINE 30 MG/ML IJ SOLN
15.0000 mg | Freq: Once | INTRAMUSCULAR | Status: AC
  Administered 2018-12-28: 15 mg via INTRAVENOUS
  Filled 2018-12-28: qty 1

## 2018-12-28 MED ORDER — ALBUTEROL SULFATE (2.5 MG/3ML) 0.083% IN NEBU: 2.5000 mg | INHALATION_SOLUTION | RESPIRATORY_TRACT | Status: DC | PRN

## 2018-12-28 MED ORDER — ONDANSETRON HCL 4 MG PO TABS: 4.0000 mg | ORAL_TABLET | Freq: Four times a day (QID) | ORAL | Status: DC | PRN

## 2018-12-28 MED ORDER — MISOPROSTOL 200 MCG PO TABS
200.0000 ug | ORAL_TABLET | Freq: Once | ORAL | Status: DC
  Filled 2018-12-28: qty 1

## 2018-12-28 MED ORDER — POLYETHYLENE GLYCOL 3350 17 G PO PACK: 17.0000 g | PACK | Freq: Every day | ORAL | Status: DC | PRN

## 2018-12-28 MED ORDER — ALUM & MAG HYDROXIDE-SIMETH 200-200-20 MG/5ML PO SUSP
30.0000 mL | Freq: Once | ORAL | Status: DC
  Filled 2018-12-28: qty 30

## 2018-12-28 MED ORDER — ACETAMINOPHEN 650 MG RE SUPP: 650.0000 mg | Freq: Four times a day (QID) | RECTAL | Status: DC | PRN

## 2018-12-28 MED ORDER — ENOXAPARIN SODIUM 40 MG/0.4ML ~~LOC~~ SOLN: 40.0000 mg | SUBCUTANEOUS | Status: DC

## 2018-12-28 MED ORDER — ASPIRIN 81 MG PO CHEW
324.0000 mg | CHEWABLE_TABLET | Freq: Once | ORAL | Status: AC
  Administered 2018-12-28: 324 mg via ORAL
  Filled 2018-12-28: qty 4

## 2018-12-28 MED ORDER — ONDANSETRON HCL 4 MG/2ML IJ SOLN: 4.0000 mg | Freq: Four times a day (QID) | INTRAMUSCULAR | Status: DC | PRN

## 2018-12-28 MED ORDER — MORPHINE SULFATE (PF) 4 MG/ML IV SOLN
4.0000 mg | Freq: Once | INTRAVENOUS | Status: AC
  Administered 2018-12-28: 4 mg via INTRAVENOUS
  Filled 2018-12-28: qty 1

## 2018-12-28 MED ORDER — ALUM & MAG HYDROXIDE-SIMETH 200-200-20 MG/5ML PO SUSP
30.0000 mL | Freq: Once | ORAL | Status: AC
  Administered 2018-12-28: 30 mL via ORAL

## 2018-12-28 MED ORDER — NITROGLYCERIN 0.4 MG SL SUBL
0.4000 mg | SUBLINGUAL_TABLET | SUBLINGUAL | Status: DC | PRN
  Administered 2018-12-28: 0.4 mg via SUBLINGUAL
  Filled 2018-12-28: qty 1

## 2018-12-28 MED ORDER — FAMOTIDINE IN NACL 20-0.9 MG/50ML-% IV SOLN
20.0000 mg | Freq: Once | INTRAVENOUS | Status: DC
  Filled 2018-12-28: qty 50

## 2018-12-28 MED ORDER — HYDROCODONE-ACETAMINOPHEN 5-325 MG PO TABS
1.0000 | ORAL_TABLET | Freq: Once | ORAL | Status: DC
  Filled 2018-12-28: qty 1

## 2018-12-28 MED ORDER — ACETAMINOPHEN 325 MG PO TABS: 650.0000 mg | ORAL_TABLET | Freq: Four times a day (QID) | ORAL | Status: DC | PRN

## 2018-12-28 NOTE — ED Notes (Signed)
Pt reports new onset chest pain across the middle of the chest. Rates it a 7/10. Pt has been pain free in his chest for hours. Pt reports heaviness. Repeat EKG done and given to MD.

## 2018-12-28 NOTE — ED Notes (Signed)
Patient resting in bed.

## 2018-12-28 NOTE — ED Provider Notes (Signed)
[facility] Emergency Department Provider Note  ____________________________________________   First MD Initiated Contact with Patient 12/28/18 [phone]     (approximate)  I have reviewed the triage vital signs and the nursing notes.   HISTORY  Chief Complaint Arm Pain   HPI [name] is a 85 y.o. male who self presents to the emergency department with left humerus pain that woke him from his sleep around 11 PM roughly 4 hours prior to arrival.  The pain was described as sharp and aching severe and seemed to go from his mid humerus down to his wrist and back up.  Once he arrived in the emergency department he noted aching discomfort in his left chest.  He had no chest pain at home.  He has a past medical history of a four-vessel CABG and 3 months ago had a pacemaker placed.  While at home he took 1 tablet of nitroglycerin for his arm pain which did not help and he took a second nitroglycerin which did cut his pain in half.  His symptoms are nonexertional.  Not associated with shortness of breath.  No nausea.  The pain is not ripping or tearing and does not go straight to his back.  He has had some belching.  He has had a remote appendectomy as well as cholecystectomy.  No history of DVT or pulmonary embolism.  Past Medical History:  Diagnosis Date  . Aneurysm ([place])   . Coronary artery disease   . GERD (gastroesophageal reflux disease)   . History of kidney stones   . HLD (hyperlipidemia)   . Hypertension     Patient Active Problem List   Diagnosis Date Noted  . Sick sinus syndrome ([place]) 07/26/2018  . Hypertension 06/30/2017  . AAA (abdominal aortic aneurysm) without rupture ([place]) 06/30/2017  . Disc degeneration, lumbar 04/24/2014  . Mixed hyperlipidemia 08/17/2011    Past Surgical History:  Procedure Laterality Date  . APPENDECTOMY    . CARDIAC CATHETERIZATION    . CARDIAC SURGERY    . CHOLECYSTECTOMY    . CORONARY ANGIOPLASTY    . CORONARY ARTERY  BYPASS GRAFT    . MASTOIDECTOMY    . PACEMAKER INSERTION Left 07/26/2018   Procedure: INSERTION PACEMAKER-SINGLE CHAMBER INITIAL INSERT;  Surgeon: Isaias Cowman, MD;  Location: [facility] ORS;  Service: Cardiovascular;  Laterality: Left;  . TONSILLECTOMY      Prior to Admission medications   Medication Sig Start Date End Date Taking? Authorizing Provider  acetaminophen (TYLENOL) 325 MG tablet Take 650 mg by mouth every 6 (six) hours as needed.    [provider]  amLODipine-benazepril (LOTREL) 5-20 MG capsule Take 1 capsule by mouth daily.    [provider]  aspirin EC 81 MG tablet Take 81 mg by mouth daily.    [provider]  atorvastatin (LIPITOR) 20 MG tablet Take 20 mg by mouth at bedtime.     [provider]  isosorbide mononitrate (IMDUR) 30 MG 24 hr tablet Take 30 mg by mouth daily.  06/16/17   [provider]  neomycin-polymyxin-dexamethasone (MAXITROL) 0.1 % ophthalmic suspension Place 1 drop into both eyes 3 (three) times daily as needed (for burning eyes.).    [provider]  ranitidine (ZANTAC) 150 MG capsule Take 150 mg by mouth 2 (two) times daily.  03/22/17   [provider]  vitamin B-12 (CYANOCOBALAMIN) 500 MCG tablet Take 500 mcg by mouth daily.    [provider]  Allergies Crab [shellfish allergy] and Penicillins  Family History  Problem Relation Age of Onset  . Cancer Mother   . Heart disease Mother   . Heart attack Father   . Heart disease Father     Social History Social History   Tobacco Use  . Smoking status: Former Smoker    Types: Cigarettes  . Smokeless tobacco: Never Used  Substance Use Topics  . Alcohol use: Yes    Comment: wine occas.   . Drug use: No    Review of Systems Constitutional: No fever/chills Eyes: No visual changes. ENT: No sore throat. Cardiovascular: Positive for chest pain. Respiratory: Denies shortness of breath. Gastrointestinal: No abdominal  pain.  No nausea, no vomiting.  No diarrhea.  No constipation. Genitourinary: Negative for dysuria. Musculoskeletal: Positive for arm pain Skin: Negative for rash. Neurological: Negative for headaches, focal weakness or numbness.   ____________________________________________   PHYSICAL EXAM:  VITAL SIGNS: ED Triage Vitals  Enc Vitals Group     BP 12/28/18 0248 (!) 168/83     Pulse Rate 12/28/18 0248 87     Resp 12/28/18 0248 18     Temp 12/28/18 0248 (!) 97.3 F (36.3 C)     Temp Source 12/28/18 0248 Oral     SpO2 12/28/18 0248 100 %     Weight 12/28/18 0249 193 lb (87.5 kg)     Height 12/28/18 0249 6\' 2"  (1.88 m)     Head Circumference --      Peak Flow --      Pain Score 12/28/18 0249 5     Pain Loc --      Pain Edu? --      Excl. in [place]? --     Constitutional: Alert and oriented x4 appears somewhat uncomfortable though nontoxic no diaphoresis and speaks in full clear sentences Eyes: PERRL EOMI. Head: Atraumatic. Nose: No congestion/rhinnorhea. Mouth/Throat: No trismus Neck: No stridor.  Able to lie completely flat with no jugular venous distention Cardiovascular: Normal rate, regular rhythm. Grossly normal heart sounds.  Good peripheral circulation. Respiratory: Normal respiratory effort.  No retractions. Lungs CTAB and moving good air Gastrointestinal: Soft nontender no rebound no guarding no peritonitis Musculoskeletal: Focally quite tender over medial aspect of left humerus.  Neurovascularly intact Neurologic:  Normal speech and language. No gross focal neurologic deficits are appreciated. Skin:  Skin is warm, dry and intact. No rash noted. Psychiatric: Mood and affect are normal. Speech and behavior are normal.    ____________________________________________   DIFFERENTIAL includes but not limited to  Muscle strain, shoulder dislocation, rhabdomyolysis, acute coronary syndrome, gastritis ____________________________________________   LABS (all labs  ordered are listed, but only abnormal results are displayed)  Labs Reviewed  COMPREHENSIVE METABOLIC PANEL - Abnormal; Notable for the following components:      Result Value   Glucose, Bld 116 (*)    Total Bilirubin 2.7 (*)    All other components within normal limits  BRAIN NATRIURETIC PEPTIDE - Abnormal; Notable for the following components:   B Natriuretic Peptide 104.0 (*)    All other components within normal limits  TROPONIN I  CBC WITH DIFFERENTIAL/PLATELET  LIPASE, BLOOD  TROPONIN I  CK    Lab work reviewed by me shows no signs of acute ischemia x1.  Elevated bilirubin of unclear etiology __________________________________________  EKG    ED ECG REPORT I, Darel Hong, the attending physician, personally viewed and interpreted this ECG.  Date: 12/28/2018 EKG Time: 0249 Rate: 76 Rhythm: Ventricular  paced rhythm QRS Axis: Leftward axis Intervals: normal ST/T Wave abnormalities: normal Narrative Interpretation: no evidence of acute ischemia  ED ECG REPORT I, Darel Hong, the attending physician, personally viewed and interpreted this ECG.  Date: 12/28/2018 EKG Time: 0537 Rate: 63 Rhythm: Ventricular paced rhythm QRS Axis: Leftward axis Intervals: normal ST/T Wave abnormalities: normal Narrative Interpretation: no evidence of acute ischemia  ____________________________________________  RADIOLOGY  Chest x-ray reviewed by me with no acute disease Left humerus x-ray reviewed by me is no acute disease ____________________________________________   PROCEDURES  Procedure(s) performed: no  Procedures  Critical Care performed: no  ____________________________________________   INITIAL IMPRESSION / ASSESSMENT AND PLAN / ED COURSE  Pertinent labs & imaging results that were available during my care of the patient were reviewed by me and considered in my medical decision making (see chart for details).   As part of my medical decision making, I  reviewed the following data within the [place] History obtained from family if available, nursing notes, old chart and ekg, as well as notes from prior ED visits.  The patient came to the emergency department with left humerus pain that sounds quite musculoskeletal in nature.  Once he came to the emergency department he noted left-sided chest discomfort and these 2 seem to be unrelated.  I will x-ray his arm in addition to his chest and give him a dose of Toradol for the likely musculoskeletal pain.  As he is high risk with his previous CABG will get 2 troponins but I do anticipate outpatient management.  His chest pain is largely resolved at this point.     ----------------------------------------- 5:58 AM on 12/28/2018 ----------------------------------------- The patient is now reporting a recurrence of his left-sided chest pain.  He is due for a second troponin now however as his chest pain just restarted I am concerned that he has ongoing stuttering symptoms and even if this next troponin is negative could represent ongoing acute coronary syndrome.  We will give him a full dose aspirin and re-dose nitroglycerin and at this point he will require inpatient admission for full cardiac risk stratification.  I discussed with the hospitalist who has graciously agreed to admit the patient to his service.  ____________________________________________   FINAL CLINICAL IMPRESSION(S) / ED DIAGNOSES  Final diagnoses:  Chest pain, unspecified type      NEW MEDICATIONS STARTED DURING THIS VISIT:  New Prescriptions   No medications on file     Note:  This document was prepared using Dragon voice recognition software and may include unintentional dictation errors.    Darel Hong, MD 12/28/18 [phone]

## 2018-12-28 NOTE — ED Notes (Signed)
Patient returned from X-ray 

## 2018-12-28 NOTE — Discharge Summary (Signed)
[facility] [place] NAME: [name]    MR#:  [id number]  DATE OF BIRTH:  [birth year]  DATE OF ADMISSION:  12/28/2018   ADMITTING PHYSICIAN: Hillary Bow, MD  DATE OF DISCHARGE: 12/28/2018.  PRIMARY CARE PHYSICIAN: Valera Castle, MD   ADMISSION DIAGNOSIS:  Chest pain, unspecified type [R07.9] DISCHARGE DIAGNOSIS:  Active Problems:   Angina at rest [facility])  SECONDARY DIAGNOSIS:   Past Medical History:  Diagnosis Date  . Aneurysm ([place])   . Coronary artery disease   . GERD (gastroesophageal reflux disease)   . History of kidney stones   . HLD (hyperlipidemia)   . Hypertension    HOSPITAL COURSE:  Chief complaint; chest pain and left arm pain  HPI [name]  is a 85 y.o. male with a known history of CAD, hypertension, sick sinus syndrome status post pacemaker, GERD presents to the emergency room complaining of left arm pain.  This started at 11 PM patient presented to ER.  Troponin and EKG showed nothing acute.  Slowly this left arm pain spread to all over his chest.  Improved with nitro.  He also got Mylanta and morphine for the pain.  He has had recent stress test at [facility] clinic which was normal per patient.  Patient's last cardiac catheterization was 2013 without PCI.  He does have history of CABG in 1993.  Presently remained chest pain-free and was admitted to the hospital for further evaluation.  Please refer to the H&P dictated for further details.    HOSPITAL COURSE 1.  Atypical chest pain  Patient presented with atypical chest pain.  Due to complains of associated left arm pain there was concern for possible ACS.  Was admitted to the medical service.  Acute coronary syndrome ruled out.  Patient seen by cardiologist this morning.  Recommendation was to discharge patient home if patient remains asymptomatic especially with ambulation and follow-up with cardiologist next week.   No anticoagulation for atrial  fibrillation due to previous watchman device.  No need for medication management for heart rate control of atrial fibrillation with pacemaker backup.  Cleared for discharge from cardiac standpoint.   Chest pain likely musculoskeletal in origin.  Clinically and hemodynamically stable and wishes to be discharged home today.  2.Hypertension.  Continue medications    DISCHARGE CONDITIONS:  Stable CONSULTS OBTAINED:  Treatment Team:  Corey Skains, MD DRUG ALLERGIES:   Allergies  Allergen Reactions  . Crab [Shellfish Allergy] Swelling    Lips/fingers--whole body swelling (caused hospitalized) CRABS ONLY  . Penicillins Itching, Swelling and Other (See Comments)    Has patient had a PCN reaction causing immediate rash, facial/tongue/throat swelling, SOB or lightheadedness with hypotension: Yes Has patient had a PCN reaction causing severe rash involving mucus membranes or skin necrosis: No Has patient had a PCN reaction that required hospitalization No Has patient had a PCN reaction occurring within the last 10 years: No If all of the above answers are "NO", then may proceed with Cephalosporin use.   DISCHARGE MEDICATIONS:   Allergies as of 12/28/2018      Reactions   Crab [shellfish Allergy] Swelling   Lips/fingers--whole body swelling (caused hospitalized) CRABS ONLY   Penicillins Itching, Swelling, Other (See Comments)   Has patient had a PCN reaction causing immediate rash, facial/tongue/throat swelling, SOB or lightheadedness with hypotension: Yes Has patient had a PCN reaction causing severe rash involving mucus membranes or skin necrosis: No Has patient had a  PCN reaction that required hospitalization No Has patient had a PCN reaction occurring within the last 10 years: No If all of the above answers are "NO", then may proceed with Cephalosporin use.      Medication List    TAKE these medications   acetaminophen 325 MG tablet Commonly known as:  TYLENOL Take 650 mg by  mouth every 6 (six) hours as needed.   amLODipine-benazepril 5-20 MG capsule Commonly known as:  LOTREL Take 1 capsule by mouth daily.   aspirin EC 81 MG tablet Take 81 mg by mouth daily.   atorvastatin 20 MG tablet Commonly known as:  LIPITOR Take 20 mg by mouth at bedtime.   isosorbide mononitrate 30 MG 24 hr tablet Commonly known as:  IMDUR Take 30 mg by mouth daily.   neomycin-polymyxin-dexamethasone 0.1 % ophthalmic suspension Commonly known as:  MAXITROL Place 1 drop into both eyes 3 (three) times daily as needed (for burning eyes.).   ranitidine 150 MG capsule Commonly known as:  ZANTAC Take 150 mg by mouth 2 (two) times daily.   vitamin B-12 500 MCG tablet Commonly known as:  CYANOCOBALAMIN Take 500 mcg by mouth daily.        DISCHARGE INSTRUCTIONS:   DIET:  Cardiac diet DISCHARGE CONDITION:  Stable ACTIVITY:  Activity as tolerated OXYGEN:  Home Oxygen: No.  Oxygen Delivery: room air DISCHARGE LOCATION:  home   If you experience worsening of your admission symptoms, develop shortness of breath, life threatening emergency, suicidal or homicidal thoughts you must seek medical attention immediately by calling 911 or calling your MD immediately  if symptoms less severe.  You Must read complete instructions/literature along with all the possible adverse reactions/side effects for all the Medicines you take and that have been prescribed to you. Take any new Medicines after you have completely understood and accpet all the possible adverse reactions/side effects.   Please note  You were cared for by a hospitalist during your hospital stay. If you have any questions about your discharge medications or the care you received while you were in the hospital after you are discharged, you can call the unit and asked to speak with the hospitalist on call if the hospitalist that took care of you is not available. Once you are discharged, your primary care physician will  handle any further medical issues. Please note that NO REFILLS for any discharge medications will be authorized once you are discharged, as it is imperative that you return to your primary care physician (or establish a relationship with a primary care physician if you do not have one) for your aftercare needs so that they can reassess your need for medications and monitor your lab values.    On the day of Discharge:  VITAL SIGNS:  Blood pressure (!) 159/76, pulse 63, temperature (!) 97.3 F (36.3 C), temperature source Oral, resp. rate 19, height 6\' 2"  (1.88 m), weight 88.5 kg, SpO2 97 %. PHYSICAL EXAMINATION:  GENERAL:  85 y.o.-year-old patient lying in the bed with no acute distress.  EYES: Pupils equal, round, reactive to light and accommodation. No scleral icterus. Extraocular muscles intact.  HEENT: Head atraumatic, normocephalic. Oropharynx and nasopharynx clear.  NECK:  Supple, no jugular venous distention. No thyroid enlargement, no tenderness.  LUNGS: Normal breath sounds bilaterally, no wheezing, rales,rhonchi or crepitation. No use of accessory muscles of respiration.  CARDIOVASCULAR: S1, S2 normal. No murmurs, rubs, or gallops.  ABDOMEN: Soft, non-tender, non-distended. Bowel sounds present. No organomegaly or mass.  EXTREMITIES: No pedal edema, cyanosis, or clubbing.  NEUROLOGIC: Cranial nerves II through XII are intact. Muscle strength 5/5 in all extremities. Sensation intact. Gait not checked.  PSYCHIATRIC: The patient is alert and oriented x 3.  SKIN: No obvious rash, lesion, or ulcer.  DATA REVIEW:   CBC Recent Labs  Lab 12/28/18 0300  WBC 9.2  HGB 16.2  HCT 46.4  PLT 199    Chemistries  Recent Labs  Lab 12/28/18 0300  NA 141  K 3.9  CL 102  CO2 28  GLUCOSE 116*  BUN 15  CREATININE 1.06  CALCIUM 10.0  AST 33  ALT 20  ALKPHOS 94  BILITOT 2.7*     Microbiology Results  Results for orders placed or performed during the hospital encounter of 07/18/18    Surgical pcr screen     Status: Abnormal   Collection Time: 07/18/18 11:40 AM  Result Value Ref Range Status   MRSA, PCR NEGATIVE NEGATIVE Final   Staphylococcus aureus POSITIVE (A) NEGATIVE Final    Comment: (NOTE) The Xpert SA Assay (FDA approved for NASAL specimens in patients 5 years of age and older), is one component of a comprehensive surveillance program. It is not intended to diagnose infection nor to guide or monitor treatment. Performed at [facility], [address] [place], [place] [address]     RADIOLOGY:  Dg Chest 2 View  Result Date: 12/28/2018 CLINICAL DATA:  Left arm pain EXAM: CHEST - 2 VIEW COMPARISON:  07/26/2018 FINDINGS: Post sternotomy changes. Left-sided pacing device as before. No acute consolidation or effusion. Borderline cardiomegaly. No pneumothorax. IMPRESSION: No active cardiopulmonary disease. Electronically Signed   By: Donavan Foil M.D.   On: 12/28/2018 03:49   Dg Humerus Left  Result Date: 12/28/2018 CLINICAL DATA:  Awoke with left arm pain. EXAM: LEFT HUMERUS - 2+ VIEW COMPARISON:  None. FINDINGS: There is no evidence of fracture or other focal bone lesions. Soft tissues are unremarkable. IMPRESSION: Negative. Electronically Signed   By: Monte Fantasia M.D.   On: 12/28/2018 04:36     Management plans discussed with the patient, family and they are in agreement.  CODE STATUS: Partial Code   TOTAL TIME TAKING CARE OF THIS PATIENT: 32 minutes.    Franchesca Veneziano M.D on 12/28/2018 at 10:34 AM  Between 7am to 6pm - Pager - [phone]  After 6pm go to [email or web address] - Proofreader  [facility] [facility]  Office  [phone]  CC: Primary care physician; Valera Castle, MD   Note: This dictation was prepared with Dragon dictation along with smaller phrase technology. Any transcriptional errors that result from this process are unintentional.

## 2018-12-28 NOTE — H&P (Signed)
[facility] [place] NAME: [name]    MR#:  [id number]  DATE OF BIRTH:  1932-12-08  DATE OF ADMISSION:  12/28/2018  PRIMARY CARE PHYSICIAN: Valera Castle, MD   REQUESTING/REFERRING PHYSICIAN:   CHIEF COMPLAINT:   Chief Complaint  Patient presents with  . Arm Pain    HISTORY OF PRESENT ILLNESS:  [name]  is a 85 y.o. male with a known history of CAD, hypertension, sick sinus syndrome status post pacemaker, GERD presents to the emergency room complaining of left arm pain.  This started at 11 PM patient presented to ER.  Troponin and EKG showed nothing acute.  Slowly this left arm pain spread to all over his chest.  Improved with nitro.  He also got Mylanta and morphine for the pain.  He has had recent stress test at [facility] clinic which was normal per patient.  Patient's last cardiac catheterization was 2013 without PCI.  He does have history of CABG in 1993.  Presently is chest pain-free and is being admitted to the hospital.  PAST MEDICAL HISTORY:   Past Medical History:  Diagnosis Date  . Aneurysm ([place])   . Coronary artery disease   . GERD (gastroesophageal reflux disease)   . History of kidney stones   . HLD (hyperlipidemia)   . Hypertension     PAST SURGICAL HISTORY:   Past Surgical History:  Procedure Laterality Date  . APPENDECTOMY    . CARDIAC CATHETERIZATION    . CARDIAC SURGERY    . CHOLECYSTECTOMY    . CORONARY ANGIOPLASTY    . CORONARY ARTERY BYPASS GRAFT    . MASTOIDECTOMY    . PACEMAKER INSERTION Left 07/26/2018   Procedure: INSERTION PACEMAKER-SINGLE CHAMBER INITIAL INSERT;  Surgeon: Isaias Cowman, MD;  Location: [facility] ORS;  Service: Cardiovascular;  Laterality: Left;  . TONSILLECTOMY      SOCIAL HISTORY:   Social History   Tobacco Use  . Smoking status: Former Smoker    Types: Cigarettes  . Smokeless tobacco: Never Used  Substance Use Topics  . Alcohol use: Yes    Comment: wine  occas.     FAMILY HISTORY:   Family History  Problem Relation Age of Onset  . Cancer Mother   . Heart disease Mother   . Heart attack Father   . Heart disease Father     DRUG ALLERGIES:   Allergies  Allergen Reactions  . Crab [Shellfish Allergy] Swelling    Lips/fingers--whole body swelling (caused hospitalized) CRABS ONLY  . Penicillins Itching, Swelling and Other (See Comments)    Has patient had a PCN reaction causing immediate rash, facial/tongue/throat swelling, SOB or lightheadedness with hypotension: Yes Has patient had a PCN reaction causing severe rash involving mucus membranes or skin necrosis: No Has patient had a PCN reaction that required hospitalization No Has patient had a PCN reaction occurring within the last 10 years: No If all of the above answers are "NO", then may proceed with Cephalosporin use.    REVIEW OF SYSTEMS:   Review of Systems  Constitutional: Positive for malaise/fatigue. Negative for chills and fever.  HENT: Negative for sore throat.   Eyes: Negative for blurred vision, double vision and pain.  Respiratory: Negative for cough, hemoptysis, shortness of breath and wheezing.   Cardiovascular: Positive for chest pain. Negative for palpitations, orthopnea and leg swelling.  Gastrointestinal: Negative for abdominal pain, constipation, diarrhea, heartburn, nausea and vomiting.  Genitourinary: Negative for dysuria and hematuria.  Musculoskeletal: Negative for back pain and joint pain.  Skin: Negative for rash.  Neurological: Negative for sensory change, speech change, focal weakness and headaches.  Endo/Heme/Allergies: Does not bruise/bleed easily.  Psychiatric/Behavioral: Negative for depression. The patient is not nervous/anxious.     MEDICATIONS AT HOME:   Prior to Admission medications   Medication Sig Start Date End Date Taking? Authorizing Provider  acetaminophen (TYLENOL) 325 MG tablet Take 650 mg by mouth every 6 (six) hours as needed.    Yes [provider]  amLODipine-benazepril (LOTREL) 5-20 MG capsule Take 1 capsule by mouth daily.   Yes [provider]  aspirin EC 81 MG tablet Take 81 mg by mouth daily.   Yes [provider]  atorvastatin (LIPITOR) 20 MG tablet Take 20 mg by mouth at bedtime.    Yes [provider]  isosorbide mononitrate (IMDUR) 30 MG 24 hr tablet Take 30 mg by mouth daily.  06/16/17  Yes [provider]  neomycin-polymyxin-dexamethasone (MAXITROL) 0.1 % ophthalmic suspension Place 1 drop into both eyes 3 (three) times daily as needed (for burning eyes.).   Yes [provider]  ranitidine (ZANTAC) 150 MG capsule Take 150 mg by mouth 2 (two) times daily.  03/22/17  Yes [provider]  vitamin B-12 (CYANOCOBALAMIN) 500 MCG tablet Take 500 mcg by mouth daily.   Yes [provider]     VITAL SIGNS:  Blood pressure (!) 160/77, pulse 60, temperature (!) 97.3 F (36.3 C), temperature source Oral, resp. rate 13, height 6\' 2"  (1.88 m), weight 87.5 kg, SpO2 96 %.  PHYSICAL EXAMINATION:  Physical Exam  GENERAL:  85 y.o.-year-old patient lying in the bed with no acute distress.  EYES: Pupils equal, round, reactive to light and accommodation. No scleral icterus. Extraocular muscles intact.  HEENT: Head atraumatic, normocephalic. Oropharynx and nasopharynx clear. No oropharyngeal erythema, moist oral mucosa  NECK:  Supple, no jugular venous distention. No thyroid enlargement, no tenderness.  LUNGS: Normal breath sounds bilaterally, no wheezing, rales, rhonchi. No use of accessory muscles of respiration.  CARDIOVASCULAR: S1, S2 normal. No murmurs, rubs, or gallops.  ABDOMEN: Soft, nontender, nondistended. Bowel sounds present. No organomegaly or mass.  EXTREMITIES: No pedal edema, cyanosis, or clubbing. + 2 pedal & radial pulses b/l.   NEUROLOGIC: Cranial nerves II through XII are intact. No focal Motor or sensory deficits appreciated  b/l PSYCHIATRIC: The patient is alert and oriented x 3. Good affect.  SKIN: No obvious rash, lesion, or ulcer.   LABORATORY PANEL:   CBC Recent Labs  Lab 12/28/18 0300  WBC 9.2  HGB 16.2  HCT 46.4  PLT 199   ------------------------------------------------------------------------------------------------------------------  Chemistries  Recent Labs  Lab 12/28/18 0300  NA 141  K 3.9  CL 102  CO2 28  GLUCOSE 116*  BUN 15  CREATININE 1.06  CALCIUM 10.0  AST 33  ALT 20  ALKPHOS 94  BILITOT 2.7*   ------------------------------------------------------------------------------------------------------------------  Cardiac Enzymes Recent Labs  Lab 12/28/18 0613  TROPONINI <0.03   ------------------------------------------------------------------------------------------------------------------  RADIOLOGY:  Dg Chest 2 View  Result Date: 12/28/2018 CLINICAL DATA:  Left arm pain EXAM: CHEST - 2 VIEW COMPARISON:  07/26/2018 FINDINGS: Post sternotomy changes. Left-sided pacing device as before. No acute consolidation or effusion. Borderline cardiomegaly. No pneumothorax. IMPRESSION: No active cardiopulmonary disease. Electronically Signed   By: Donavan Foil M.D.   On: 12/28/2018 03:49   Dg Humerus Left  Result Date: 12/28/2018 CLINICAL DATA:  Awoke with left arm pain. EXAM: LEFT  HUMERUS - 2+ VIEW COMPARISON:  None. FINDINGS: There is no evidence of fracture or other focal bone lesions. Soft tissues are unremarkable. IMPRESSION: Negative. Electronically Signed   By: Monte Fantasia M.D.   On: 12/28/2018 04:36     IMPRESSION AND PLAN:   *Chest pain with left arm pain improved with nitro.  Raises concern for unstable angina.  He did get nitro Mylanta and morphine together which makes it unclear if this is unstable angina or GERD.  Will repeat troponin.  Admit under observation.  Continue telemetry monitoring.  Nitro as needed.  Consult cardiology Dr. Nehemiah Massed.  Message sent.   Will keep him n.p.o. till cardiology evaluation.  *Hypertension.  Continue medications  *DVT prophylaxis with Lovenox  All the records are reviewed and case discussed with ED provider. Management plans discussed with the patient, family and they are in agreement.  CODE STATUS: Partial code.  No intubation  TOTAL TIME TAKING CARE OF THIS PATIENT: 35 minutes.   Neita Carp M.D on 12/28/2018 at 6:43 AM  Between 7am to 6pm - Pager - [phone]  After 6pm go to [email or web address] - password [email or web address] [facility]  Office  [phone]  CC: Primary care physician; Valera Castle, MD  Note: This dictation was prepared with Dragon dictation along with smaller phrase technology. Any transcriptional errors that result from this process are unintentional.

## 2018-12-28 NOTE — ED Notes (Addendum)
Pain started around 10:30 pm in left side of chest radiating down left arm. Patient went to bed around 11:15 pm. Woke up hurting worse pain around 1 am. Patient states took nitro at 1am then took second dose of nitro at 1:10 am and pain was 10/10. Patient states called wife because of pain. Second nitro started helping pain after about 10 mins after taking. Patient states still having chest pain 5/10 on left side not radiating feels like a lot of pressure right under pacemaker.

## 2018-12-28 NOTE — Progress Notes (Addendum)
MD paged to ask about clarification of troponin blood draw at 1300. Per MD patient stable to go home anytime if no issues with ambulation. Ambulated patient around nurses station x2 with no issues or complaints. No chest pain or shortness of breath. Wife will be taking patient home.   Update 1138: discharge paperwork given. Verbalized understanding with no questions or concerns. IV taken out and tele monitor off. Wife on the way to pick up patient.   Update 1212: patient's wife here to pick up patient. Patient going home stable with no complaints.

## 2018-12-28 NOTE — ED Notes (Signed)
ED TO INPATIENT HANDOFF REPORT  Name/Age/Gender [name] 85 y.o. male  Code Status    Code Status Orders  (From admission, onward)         Start     Ordered   12/28/18 0641  Limited resuscitation (code)  Continuous    Question Answer Comment  In the event of cardiac or respiratory ARREST: Initiate Code Blue, Call Rapid Response Yes   In the event of cardiac or respiratory ARREST: Perform CPR Yes   In the event of cardiac or respiratory ARREST: Perform Intubation/Mechanical Ventilation No   In the event of cardiac or respiratory ARREST: Use NIPPV/BiPAp only if indicated Yes   In the event of cardiac or respiratory ARREST: Administer ACLS medications if indicated Yes   In the event of cardiac or respiratory ARREST: Perform Defibrillation or Cardioversion if indicated Yes      12/28/18 0642        Code Status History    Date Active Date Inactive Code Status Order ID Comments User Context   07/26/2018 1635 07/27/2018 1340 Full Code [id number]  Isaias Cowman, MD Inpatient      Home/SNF/Other Home  Chief Complaint arm pain  Level of Care/Admitting Diagnosis ED Disposition    ED Disposition Condition [place]: [place] [100120]  Level of Care: Telemetry [5]  Diagnosis: Angina at rest [facility]) [270623]  Admitting Physician: Hillary Bow [[id number]]  Attending Physician: Hillary Bow [phone]  PT Class (Do Not Modify): Observation [104]  PT Acc Code (Do Not Modify): Observation [10022]       Medical History Past Medical History:  Diagnosis Date  . Aneurysm ([place])   . Coronary artery disease   . GERD (gastroesophageal reflux disease)   . History of kidney stones   . HLD (hyperlipidemia)   . Hypertension     Allergies Allergies  Allergen Reactions  . Crab [Shellfish Allergy] Swelling    Lips/fingers--whole body swelling (caused hospitalized) CRABS ONLY  . Penicillins Itching, Swelling and Other (See  Comments)    Has patient had a PCN reaction causing immediate rash, facial/tongue/throat swelling, SOB or lightheadedness with hypotension: Yes Has patient had a PCN reaction causing severe rash involving mucus membranes or skin necrosis: No Has patient had a PCN reaction that required hospitalization No Has patient had a PCN reaction occurring within the last 10 years: No If all of the above answers are "NO", then may proceed with Cephalosporin use.    IV Location/Drains/Wounds Patient Lines/Drains/Airways Status   Active Line/Drains/Airways    Name:   Placement date:   Placement time:   Site:   Days:   Incision (Closed) 07/26/18 Chest Left   07/26/18    1244     155          Labs/Imaging Results for orders placed or performed during the hospital encounter of 12/28/18 (from the past 48 hour(s))  Comprehensive metabolic panel     Status: Abnormal   Collection Time: 12/28/18  3:00 AM  Result Value Ref Range   Sodium 141 135 - 145 mmol/L   Potassium 3.9 3.5 - 5.1 mmol/L    Comment: HEMOLYSIS AT THIS LEVEL MAY AFFECT RESULT   Chloride 102 98 - 111 mmol/L   CO2 28 22 - 32 mmol/L   Glucose, Bld 116 (H) 70 - 99 mg/dL   BUN 15 8 - 23 mg/dL   Creatinine, Ser 1.06 0.61 - 1.24 mg/dL   Calcium 10.0  8.9 - 10.3 mg/dL   Total Protein 8.1 6.5 - 8.1 g/dL   Albumin 4.6 3.5 - 5.0 g/dL   AST 33 15 - 41 U/L    Comment: HEMOLYSIS AT THIS LEVEL MAY AFFECT RESULT   ALT 20 0 - 44 U/L    Comment: HEMOLYSIS AT THIS LEVEL MAY AFFECT RESULT   Alkaline Phosphatase 94 38 - 126 U/L   Total Bilirubin 2.7 (H) 0.3 - 1.2 mg/dL    Comment: HEMOLYSIS AT THIS LEVEL MAY AFFECT RESULT   GFR calc non Af Amer >60 >60 mL/min   GFR calc Af Amer >60 >60 mL/min   Anion gap 11 5 - 15    Comment: Performed at [facility], [address] [place], [place] [address]  Brain natriuretic peptide     Status: Abnormal   Collection Time: 12/28/18  3:00 AM  Result Value Ref Range   B Natriuretic Peptide 104.0 (H)  0.0 - 100.0 pg/mL    Comment: Performed at [facility], [address] [place], [place] [address]  Troponin I - Once     Status: None   Collection Time: 12/28/18  3:00 AM  Result Value Ref Range   Troponin I <0.03 <0.03 ng/mL    Comment: Performed at [facility], [address] [place], [place] [address]  CBC with Differential     Status: None   Collection Time: 12/28/18  3:00 AM  Result Value Ref Range   WBC 9.2 4.0 - 10.5 K/uL   RBC 5.02 4.22 - 5.81 MIL/uL   Hemoglobin 16.2 13.0 - 17.0 g/dL   HCT 46.4 39.0 - 52.0 %   MCV 92.4 80.0 - 100.0 fL   MCH 32.3 26.0 - 34.0 pg   MCHC 34.9 30.0 - 36.0 g/dL   RDW 12.7 11.5 - 15.5 %   Platelets 199 150 - 400 K/uL   nRBC 0.0 0.0 - 0.2 %   Neutrophils Relative % 64 %   Neutro Abs 5.9 1.7 - 7.7 K/uL   Lymphocytes Relative 22 %   Lymphs Abs 2.1 0.7 - 4.0 K/uL   Monocytes Relative 9 %   Monocytes Absolute 0.8 0.1 - 1.0 K/uL   Eosinophils Relative 4 %   Eosinophils Absolute 0.3 0.0 - 0.5 K/uL   Basophils Relative 1 %   Basophils Absolute 0.1 0.0 - 0.1 K/uL   Immature Granulocytes 0 %   Abs Immature Granulocytes 0.03 0.00 - 0.07 K/uL    Comment: Performed at [facility], [address] [place], [place] [address]  Lipase, blood     Status: None   Collection Time: 12/28/18  3:00 AM  Result Value Ref Range   Lipase 25 11 - 51 U/L    Comment: Performed at [facility], [address] [place], [place] [address]  Troponin I - Once     Status: None   Collection Time: 12/28/18  6:13 AM  Result Value Ref Range   Troponin I <0.03 <0.03 ng/mL    Comment: Performed at [facility], [address] [place], [place] [address]  CK     Status: Abnormal   Collection Time: 12/28/18  6:13 AM  Result Value Ref Range   Total CK 36 (L) 49 - 397 U/L    Comment: Performed at [facility], [address], [place], [place] [address]   Dg Chest 2 View  Result Date: 12/28/2018 CLINICAL DATA:   Left arm pain EXAM: CHEST -  2 VIEW COMPARISON:  07/26/2018 FINDINGS: Post sternotomy changes. Left-sided pacing device as before. No acute consolidation or effusion. Borderline cardiomegaly. No pneumothorax. IMPRESSION: No active cardiopulmonary disease. Electronically Signed   By: Donavan Foil M.D.   On: 12/28/2018 03:49   Dg Humerus Left  Result Date: 12/28/2018 CLINICAL DATA:  Awoke with left arm pain. EXAM: LEFT HUMERUS - 2+ VIEW COMPARISON:  None. FINDINGS: There is no evidence of fracture or other focal bone lesions. Soft tissues are unremarkable. IMPRESSION: Negative. Electronically Signed   By: Monte Fantasia M.D.   On: 12/28/2018 04:36    Pending Labs Unresulted Labs (From admission, onward)    Start     Ordered   01/04/19 0500  Creatinine, serum  (enoxaparin (LOVENOX)    CrCl >/= 30 ml/min)  Weekly,   STAT    Comments:  while on enoxaparin therapy    12/28/18 0642   12/28/18 0700  Troponin I - Now Then Q6H  Now then every 6 hours,   STAT     12/28/18 0642          Vitals/Pain Today's Vitals   12/28/18 0630 12/28/18 0700 12/28/18 0722 12/28/18 0725  BP: (!) 169/80 (!) 150/78    Pulse: 66 60    Resp: 13 19    Temp:      TempSrc:      SpO2: 100% 97% 97%   Weight:      Height:      PainSc:    0-No pain    Isolation Precautions No active isolations  Medications Medications  nitroGLYCERIN (NITROSTAT) SL tablet 0.4 mg (0.4 mg Sublingual Given 12/28/18 0602)  enoxaparin (LOVENOX) injection 40 mg (has no administration in time range)  sodium chloride flush (NS) 0.9 % injection 3 mL (has no administration in time range)  acetaminophen (TYLENOL) tablet 650 mg (has no administration in time range)    Or  acetaminophen (TYLENOL) suppository 650 mg (has no administration in time range)  polyethylene glycol (MIRALAX / GLYCOLAX) packet 17 g (has no administration in time range)  ondansetron (ZOFRAN) tablet 4 mg (has no administration in time range)    Or  ondansetron  (ZOFRAN) injection 4 mg (has no administration in time range)  albuterol (PROVENTIL) (2.5 MG/3ML) 0.083% nebulizer solution 2.5 mg (has no administration in time range)  morphine 4 MG/ML injection 4 mg (4 mg Intravenous Given 12/28/18 0411)  ketorolac (TORADOL) 30 MG/ML injection 15 mg (15 mg Intravenous Given 12/28/18 0410)  aspirin chewable tablet 324 mg (324 mg Oral Given 12/28/18 0604)  alum & mag hydroxide-simeth (MAALOX/MYLANTA) 200-200-20 MG/5ML suspension 30 mL (30 mLs Oral Given 12/28/18 0558)    Mobility Pt is ambulatory with a steady gait. Per pt, he doesn't use assistive devices for mobility.

## 2018-12-28 NOTE — ED Notes (Signed)
Patient transported to X-ray 

## 2018-12-28 NOTE — Consult Note (Signed)
[facility] Consultation Note  Patient ID: [name], MRN: [id number], DOB/AGE: November 27, 1933 85 y.o. Admit date: 12/28/2018   Date of Consult: 12/28/2018 Primary Physician: Valera Castle, MD Primary Cardiologist: Nehemiah Massed  Chief Complaint:  Chief Complaint  Patient presents with  . Arm Pain   Reason for Consult: Chest pain  HPI: 85 y.o. male with known cardiovascular disease essential hypertension mixed hyperlipidemia and paroxysmal nonvalvular atrial fibrillation with sick sinus syndrome status post previous pacemaker placement.  The patient has had atrial fibrillation and currently has atrial fibrillation with pacemaker control and a previous watchman device not requiring anticoagulation.  The patient has had known cardiovascular disease and done fairly well recently without evidence of myocardial infarction.  The patient had new onset severe left arm pain and substernal chest discomfort radiating into his back occurring spontaneously.  He took nitroglycerin which did not help.  After getting other medication management he burped and felt better.  Then his EKG did not change and he has had a normal troponin.  He feels much better at this time with no further cardiovascular symptoms  Past Medical History:  Diagnosis Date  . Aneurysm ([place])   . Coronary artery disease   . GERD (gastroesophageal reflux disease)   . History of kidney stones   . HLD (hyperlipidemia)   . Hypertension       Surgical History:  Past Surgical History:  Procedure Laterality Date  . APPENDECTOMY    . CARDIAC CATHETERIZATION    . CARDIAC SURGERY    . CHOLECYSTECTOMY    . CORONARY ANGIOPLASTY    . CORONARY ARTERY BYPASS GRAFT    . MASTOIDECTOMY    . PACEMAKER INSERTION Left 07/26/2018   Procedure: INSERTION PACEMAKER-SINGLE CHAMBER INITIAL INSERT;  Surgeon: Isaias Cowman, MD;  Location: [facility] ORS;  Service: Cardiovascular;  Laterality: Left;  . TONSILLECTOMY       Home  Meds: Prior to Admission medications   Medication Sig Start Date End Date Taking? Authorizing Provider  acetaminophen (TYLENOL) 325 MG tablet Take 650 mg by mouth every 6 (six) hours as needed.   Yes [provider]  amLODipine-benazepril (LOTREL) 5-20 MG capsule Take 1 capsule by mouth daily.   Yes [provider]  aspirin EC 81 MG tablet Take 81 mg by mouth daily.   Yes [provider]  atorvastatin (LIPITOR) 20 MG tablet Take 20 mg by mouth at bedtime.    Yes [provider]  isosorbide mononitrate (IMDUR) 30 MG 24 hr tablet Take 30 mg by mouth daily.  06/16/17  Yes [provider]  neomycin-polymyxin-dexamethasone (MAXITROL) 0.1 % ophthalmic suspension Place 1 drop into both eyes 3 (three) times daily as needed (for burning eyes.).   Yes [provider]  ranitidine (ZANTAC) 150 MG capsule Take 150 mg by mouth 2 (two) times daily.  03/22/17  Yes [provider]  vitamin B-12 (CYANOCOBALAMIN) 500 MCG tablet Take 500 mcg by mouth daily.   Yes [provider]    Inpatient Medications:  . enoxaparin (LOVENOX) injection  40 mg Subcutaneous Q24H  . sodium chloride flush  3 mL Intravenous Q12H     Allergies:  Allergies  Allergen Reactions  . Crab [Shellfish Allergy] Swelling    Lips/fingers--whole body swelling (caused hospitalized) CRABS ONLY  . Penicillins Itching, Swelling and Other (See Comments)    Has patient had a PCN reaction causing immediate rash, facial/tongue/throat swelling, SOB or lightheadedness with hypotension: Yes Has patient had a PCN reaction  causing severe rash involving mucus membranes or skin necrosis: No Has patient had a PCN reaction that required hospitalization No Has patient had a PCN reaction occurring within the last 10 years: No If all of the above answers are "NO", then may proceed with Cephalosporin use.    Social History   Socioeconomic History  . Marital status: Married    Spouse  name: Not on file  . Number of children: Not on file  . Years of education: Not on file  . Highest education level: Not on file  Occupational History  . Not on file  Social Needs  . Financial resource strain: Not on file  . Food insecurity:    Worry: Not on file    Inability: Not on file  . Transportation needs:    Medical: Not on file    Non-medical: Not on file  Tobacco Use  . Smoking status: Former Smoker    Types: Cigarettes  . Smokeless tobacco: Never Used  Substance and Sexual Activity  . Alcohol use: Yes    Comment: wine occas.   . Drug use: No  . Sexual activity: Not on file  Lifestyle  . Physical activity:    Days per week: Not on file    Minutes per session: Not on file  . Stress: Not on file  Relationships  . Social connections:    Talks on phone: Not on file    Gets together: Not on file    Attends religious service: Not on file    Active member of club or organization: Not on file    Attends meetings of clubs or organizations: Not on file    Relationship status: Not on file  . Intimate partner violence:    Fear of current or ex partner: Not on file    Emotionally abused: Not on file    Physically abused: Not on file    Forced sexual activity: Not on file  Other Topics Concern  . Not on file  Social History Narrative  . Not on file     Family History  Problem Relation Age of Onset  . Cancer Mother   . Heart disease Mother   . Heart attack Father   . Heart disease Father      Review of Systems Positive for chest pain heartburn Negative for: General:  chills, fever, night sweats or weight changes.  Cardiovascular: PND orthopnea syncope dizziness  Dermatological skin lesions rashes Respiratory: Cough congestion Urologic: Frequent urination urination at night and hematuria Abdominal: negative for nausea, vomiting, diarrhea, bright red blood per rectum, melena, or hematemesis Neurologic: negative for visual changes, and/or hearing changes  All  other systems reviewed and are otherwise negative except as noted above.  Labs: Recent Labs    12/28/18 0300 12/28/18 0613  CKTOTAL  --  36*  TROPONINI <0.03 <0.03   Lab Results  Component Value Date   WBC 9.2 12/28/2018   HGB 16.2 12/28/2018   HCT 46.4 12/28/2018   MCV 92.4 12/28/2018   PLT 199 12/28/2018    Recent Labs  Lab 12/28/18 0300  NA 141  K 3.9  CL 102  CO2 28  BUN 15  CREATININE 1.06  CALCIUM 10.0  PROT 8.1  BILITOT 2.7*  ALKPHOS 94  ALT 20  AST 33  GLUCOSE 116*   Lab Results  Component Value Date   CHOL 120 08/31/2012   HDL 24 (L) 08/31/2012   LDLCALC 65 08/31/2012   TRIG 157 08/31/2012  No results found for: DDIMER  Radiology/Studies:  Dg Chest 2 View  Result Date: 12/28/2018 CLINICAL DATA:  Left arm pain EXAM: CHEST - 2 VIEW COMPARISON:  07/26/2018 FINDINGS: Post sternotomy changes. Left-sided pacing device as before. No acute consolidation or effusion. Borderline cardiomegaly. No pneumothorax. IMPRESSION: No active cardiopulmonary disease. Electronically Signed   By: Donavan Foil M.D.   On: 12/28/2018 03:49   Dg Humerus Left  Result Date: 12/28/2018 CLINICAL DATA:  Awoke with left arm pain. EXAM: LEFT HUMERUS - 2+ VIEW COMPARISON:  None. FINDINGS: There is no evidence of fracture or other focal bone lesions. Soft tissues are unremarkable. IMPRESSION: Negative. Electronically Signed   By: Monte Fantasia M.D.   On: 12/28/2018 04:36    EKG: Atrial fibrillation with ventricular pacing  Weights: Filed Weights   12/28/18 0249  Weight: 87.5 kg     Physical Exam: Blood pressure (!) 159/76, pulse 63, temperature (!) 97.3 F (36.3 C), temperature source Oral, resp. rate 19, height 6\' 2"  (1.88 m), weight 87.5 kg, SpO2 97 %. Body mass index is 24.78 kg/m. General: Well developed, well nourished, in no acute distress. Head eyes ears nose throat: Normocephalic, atraumatic, sclera non-icteric, no xanthomas, nares are without discharge. No  apparent thyromegaly and/or mass  Lungs: Normal respiratory effort.  no wheezes, no rales, no rhonchi.  Heart: Irregular with normal S1 S2. no murmur gallop, no rub, PMI is normal size and placement, carotid upstroke normal without bruit, jugular venous pressure is normal Abdomen: Soft, non-tender, non-distended with normoactive bowel sounds. No hepatomegaly. No rebound/guarding. No obvious abdominal masses. Abdominal aorta is normal size without bruit Extremities: Trace edema. no cyanosis, no clubbing, no ulcers  Peripheral : 2+ bilateral upper extremity pulses, 2+ bilateral femoral pulses, 2+ bilateral dorsal pedal pulse Neuro: Alert and oriented. No facial asymmetry. No focal deficit. Moves all extremities spontaneously. Musculoskeletal: Normal muscle tone without kyphosis Psych:  Responds to questions appropriately with a normal affect.    Assessment: 85 year old male with chronic atrial fibrillation with sick sinus syndrome coronary artery disease on appropriate medication management with atypical chest discomfort relieved without evidence of heart failure or myocardial infarction  Plan: 1.  No anticoagulation for atrial fibrillation due to previous watchman device 2.  No need for medication management for heart rate control of atrial fibrillation with pacemaker backup 3.  Continue surveillance of EKG and troponins for possible myocardial infarction although if normal troponin and full relief of chest pain and ambulating well without further symptoms the patient may be discharged home from cardiac standpoint with follow-up next week  Signed, Corey Skains M.D. [facility] 12/28/2018, 9:09 AM

## 2018-12-28 NOTE — ED Notes (Signed)
Patient hooked back to cardiac monitor.

## 2018-12-28 NOTE — Plan of Care (Signed)

## 2018-12-28 NOTE — Progress Notes (Signed)
Advance care planning  Purpose of Encounter Angina, CAD  Parties in Attendance Patient and wife  Patients Decisional capacity And oriented.  Able to make medical decisions Designated healthcare power of attorney is his wife who is at bedside No advance directives in place  Patient regarding this chest pain, CAD, need for observation admission, prognosis and treatment.  CODE STATUS discussed.  Patient does not want to be placed on a ventilator.  He is okay with having CPR and defibrillation done if needed.  Orders entered and CODE STATUS changed to partial code  DNI   Time spent - 17 minutes

## 2018-12-28 NOTE — ED Notes (Signed)
Admitting doctor in with patient at this time  

## 2018-12-28 NOTE — ED Triage Notes (Signed)
Patient ambulatory to triage with steady gait, without difficulty or distress noted; pt reports awoke with left arm pain hr PTA; took NTG x 2 with some relief; st pain accomp by belching; st hx of same with card bypass surg; pacemaker placed 2-46mos ago

## 2018-12-28 NOTE — ED Notes (Signed)
Pt reports increase in chest pain now. Pt is unable to lay still. Pt is in obvious pain. New EKG obtained. MD aware.

## 2018-12-28 NOTE — ED Notes (Signed)
Pt transported to room 260 

## 2018-12-28 NOTE — ED Notes (Signed)
Pt reports a decrease in chest pain at this time.

## 2019-04-18 ENCOUNTER — Other Ambulatory Visit: Payer: Self-pay | Admitting: Family Medicine

## 2019-04-18 DIAGNOSIS — R1030 Lower abdominal pain, unspecified: Secondary | ICD-10-CM

## 2019-04-20 ENCOUNTER — Ambulatory Visit
Admission: RE | Admit: 2019-04-20 | Discharge: 2019-04-20 | Disposition: A | Discharge status: Home / Self Care | Payer: Medicare Other | Source: Ambulatory Visit | Attending: Family Medicine | Admitting: Family Medicine

## 2019-04-20 ENCOUNTER — Other Ambulatory Visit: Payer: Self-pay

## 2019-04-20 DIAGNOSIS — R1030 Lower abdominal pain, unspecified: Secondary | ICD-10-CM | POA: Diagnosis not present

## 2019-04-20 LAB — POCT I-STAT CREATININE: Creatinine, Ser: 1.1 mg/dL (ref 0.61–1.24)

## 2019-04-20 MED ORDER — IOHEXOL 300 MG/ML  SOLN
100.0000 mL | Freq: Once | INTRAMUSCULAR | Status: AC | PRN
  Administered 2019-04-20: 100 mL via INTRAVENOUS

## 2019-05-02 ENCOUNTER — Emergency Department
Admission: EM | Admit: 2019-05-02 | Discharge: 2019-05-02 | Disposition: A | Discharge status: Home / Self Care | Payer: Medicare Other | Attending: Emergency Medicine | Admitting: Emergency Medicine

## 2019-05-02 ENCOUNTER — Emergency Department: Payer: Medicare Other

## 2019-05-02 ENCOUNTER — Other Ambulatory Visit: Payer: Self-pay

## 2019-05-02 DIAGNOSIS — Z7982 Long term (current) use of aspirin: Secondary | ICD-10-CM | POA: Insufficient documentation

## 2019-05-02 DIAGNOSIS — Z951 Presence of aortocoronary bypass graft: Secondary | ICD-10-CM | POA: Insufficient documentation

## 2019-05-02 DIAGNOSIS — I2510 Atherosclerotic heart disease of native coronary artery without angina pectoris: Secondary | ICD-10-CM | POA: Insufficient documentation

## 2019-05-02 DIAGNOSIS — Z79899 Other long term (current) drug therapy: Secondary | ICD-10-CM | POA: Insufficient documentation

## 2019-05-02 DIAGNOSIS — I10 Essential (primary) hypertension: Secondary | ICD-10-CM | POA: Diagnosis not present

## 2019-05-02 DIAGNOSIS — Z20828 Contact with and (suspected) exposure to other viral communicable diseases: Secondary | ICD-10-CM | POA: Insufficient documentation

## 2019-05-02 DIAGNOSIS — Z950 Presence of cardiac pacemaker: Secondary | ICD-10-CM | POA: Diagnosis not present

## 2019-05-02 DIAGNOSIS — R1111 Vomiting without nausea: Secondary | ICD-10-CM | POA: Insufficient documentation

## 2019-05-02 DIAGNOSIS — R410 Disorientation, unspecified: Secondary | ICD-10-CM | POA: Insufficient documentation

## 2019-05-02 DIAGNOSIS — Z87891 Personal history of nicotine dependence: Secondary | ICD-10-CM | POA: Insufficient documentation

## 2019-05-02 LAB — CBC
HCT: 44.2 % (ref 39.0–52.0)
Hemoglobin: 14.9 g/dL (ref 13.0–17.0)
MCH: 31 pg (ref 26.0–34.0)
MCHC: 33.7 g/dL (ref 30.0–36.0)
MCV: 92.1 fL (ref 80.0–100.0)
Platelets: 165 10*3/uL (ref 150–400)
RBC: 4.8 MIL/uL (ref 4.22–5.81)
RDW: 14.9 % (ref 11.5–15.5)
WBC: 10.9 10*3/uL — ABNORMAL HIGH (ref 4.0–10.5)
nRBC: 0 % (ref 0.0–0.2)

## 2019-05-02 LAB — URINALYSIS, COMPLETE (UACMP) WITH MICROSCOPIC
Bacteria, UA: NONE SEEN
Bilirubin Urine: NEGATIVE
Glucose, UA: NEGATIVE mg/dL
Ketones, ur: NEGATIVE mg/dL
Leukocytes,Ua: NEGATIVE
Nitrite: NEGATIVE
Protein, ur: 100 mg/dL — AB
Specific Gravity, Urine: 1.026 (ref 1.005–1.030)
pH: 5 (ref 5.0–8.0)

## 2019-05-02 LAB — COMPREHENSIVE METABOLIC PANEL
ALT: 17 U/L (ref 0–44)
AST: 22 U/L (ref 15–41)
Albumin: 4.3 g/dL (ref 3.5–5.0)
Alkaline Phosphatase: 128 U/L — ABNORMAL HIGH (ref 38–126)
Anion gap: 10 (ref 5–15)
BUN: 23 mg/dL (ref 8–23)
CO2: 29 mmol/L (ref 22–32)
Calcium: 9.3 mg/dL (ref 8.9–10.3)
Chloride: 104 mmol/L (ref 98–111)
Creatinine, Ser: 1.03 mg/dL (ref 0.61–1.24)
GFR calc Af Amer: >60 mL/min (ref >60)
GFR calc non Af Amer: >60 mL/min (ref >60)
Glucose, Bld: 107 mg/dL — ABNORMAL HIGH (ref 70–99)
Potassium: 4.2 mmol/L (ref 3.5–5.1)
Sodium: 143 mmol/L (ref 135–145)
Total Bilirubin: 2 mg/dL — ABNORMAL HIGH (ref 0.3–1.2)
Total Protein: 7.2 g/dL (ref 6.5–8.1)

## 2019-05-02 LAB — SARS CORONAVIRUS 2 BY RT PCR (HOSPITAL ORDER, PERFORMED IN ~~LOC~~ HOSPITAL LAB): SARS Coronavirus 2: NEGATIVE

## 2019-05-02 LAB — TROPONIN I: Troponin I: <0.03 ng/mL (ref <0.03)

## 2019-05-02 LAB — LIPASE, BLOOD: Lipase: 26 U/L (ref 11–51)

## 2019-05-02 MED ORDER — SODIUM CHLORIDE 0.9% FLUSH: 3.0000 mL | Freq: Once | INTRAVENOUS | Status: DC

## 2019-05-02 NOTE — ED Notes (Signed)
Meal given with water at 8:30pm.

## 2019-05-02 NOTE — ED Provider Notes (Signed)
[facility] Emergency Department Provider Note  ____________________________________________  Time seen: Approximately 6:49 PM  I have reviewed the triage vital signs and the nursing notes.   HISTORY  Chief Complaint Abdominal Pain    HPI [name] is a 85 y.o. male with a history of hypertension, CAD, aneurism and GERD, presents to the emergency department with emesis for the past 2 weeks.  Patient reports that emesis only occurs after coughing.   Cough has been nonproductive in nature and is not associated with shortness of breath or chest pain.  Patient has had nasal congestion and rhinorrhea.  He has had occasional headache and diarrhea.  Patient had CT abdomen and pelvis on 04/21/2019 after patient presented with emesis which was largely unremarkable.  Patient states that he was supposed to follow-up with a nuclear study next week but he became concerned as he had 2 hallucinations last night.  Patient states that he thought that 5 college kids had come into the house and were staying the night.  Patient states that he had to get up in the middle of the night and when he returned, he thought he saw 5 human shapes in the bed.  Patient denies hematochezia or hemoptysis.  Last bowel movement was yesterday and patient states that he has been passing gas. No other alleviating measures have been attempted.  Patient lives with his wife and has 2 children.        Past Medical History:  Diagnosis Date  . Aneurysm ([place])   . Coronary artery disease   . GERD (gastroesophageal reflux disease)   . History of kidney stones   . HLD (hyperlipidemia)   . Hypertension     Patient Active Problem List   Diagnosis Date Noted  . Angina at rest [facility]) 12/28/2018  . Sick sinus syndrome ([place]) 07/26/2018  . Hypertension 06/30/2017  . AAA (abdominal aortic aneurysm) without rupture ([place]) 06/30/2017  . Disc degeneration, lumbar 04/24/2014  . Mixed hyperlipidemia 08/17/2011     Past Surgical History:  Procedure Laterality Date  . APPENDECTOMY    . CARDIAC CATHETERIZATION    . CARDIAC SURGERY    . CHOLECYSTECTOMY    . CORONARY ANGIOPLASTY    . CORONARY ARTERY BYPASS GRAFT    . MASTOIDECTOMY    . PACEMAKER INSERTION Left 07/26/2018   Procedure: INSERTION PACEMAKER-SINGLE CHAMBER INITIAL INSERT;  Surgeon: Isaias Cowman, MD;  Location: [facility] ORS;  Service: Cardiovascular;  Laterality: Left;  . TONSILLECTOMY      Prior to Admission medications   Medication Sig Start Date End Date Taking? Authorizing Provider  acetaminophen (TYLENOL) 325 MG tablet Take 650 mg by mouth every 6 (six) hours as needed.    [provider]  amLODipine-benazepril (LOTREL) 5-20 MG capsule Take 1 capsule by mouth daily.    [provider]  aspirin EC 81 MG tablet Take 81 mg by mouth daily.    [provider]  atorvastatin (LIPITOR) 20 MG tablet Take 20 mg by mouth at bedtime.     [provider]  isosorbide mononitrate (IMDUR) 30 MG 24 hr tablet Take 30 mg by mouth daily.  06/16/17   [provider]  neomycin-polymyxin-dexamethasone (MAXITROL) 0.1 % ophthalmic suspension Place 1 drop into both eyes 3 (three) times daily as needed (for burning eyes.).    [provider]  ranitidine (ZANTAC) 150 MG capsule Take 150 mg by mouth 2 (two) times daily.  03/22/17   [provider]  vitamin B-12 (  CYANOCOBALAMIN) 500 MCG tablet Take 500 mcg by mouth daily.    [provider]    Allergies Otho Darner allergy] and Penicillins  Family History  Problem Relation Age of Onset  . Cancer Mother   . Heart disease Mother   . Heart attack Father   . Heart disease Father     Social History Social History   Tobacco Use  . Smoking status: Former Smoker    Types: Cigarettes  . Smokeless tobacco: Never Used  Substance Use Topics  . Alcohol use: Yes    Comment: wine occas.   . Drug use: No     Review of Systems   Constitutional: No fever/chills Eyes: No visual changes. No discharge ENT: No upper respiratory complaints. Cardiovascular: no chest pain. Respiratory: Patient has cough. No SOB. Gastrointestinal: Patient has left lower quadrant abdominal discomfort since cough occurred. Patient has emesis. No diarrhea.  No constipation. Genitourinary: Negative for dysuria. No hematuria Musculoskeletal: Negative for musculoskeletal pain. Skin: Negative for rash, abrasions, lacerations, ecchymosis. Neurological: Negative for headaches, focal weakness or numbness.   ____________________________________________   PHYSICAL EXAM:  VITAL SIGNS: ED Triage Vitals  Enc Vitals Group     BP 05/02/19 1650 (!) 142/70     Pulse Rate 05/02/19 1650 64     Resp 05/02/19 1650 17     Temp 05/02/19 1650 97.7 F (36.5 C)     Temp Source 05/02/19 1650 Oral     SpO2 05/02/19 1650 97 %     Weight 05/02/19 1651 188 lb (85.3 kg)     Height 05/02/19 1651 6\' 2"  (1.88 m)     Head Circumference --      Peak Flow --      Pain Score 05/02/19 1651 6     Pain Loc --      Pain Edu? --      Excl. in [place]? --      Constitutional: Patient is alert and oriented to place.  He has distinct with historical information. Eyes: Conjunctivae are normal. PERRL. EOMI. Head: Atraumatic. ENT:      Nose: No congestion/rhinnorhea.      Mouth/Throat: Mucous membranes are moist.  Neck: Full range of motion.  No midline C-spine tenderness. Cardiovascular: Normal rate, regular rhythm. Normal S1 and S2.  Good peripheral circulation. Respiratory: Normal respiratory effort without tachypnea or retractions. Lungs CTAB. Good air entry to the bases with no decreased or absent breath sounds. Gastrointestinal: Bowel sounds 4 quadrants.  Patient has tenderness to palpation in the left lower quadrant without guarding. No palpable masses. No distention. No CVA tenderness. Musculoskeletal: Full range of motion to all extremities. No gross deformities  appreciated. Neurologic:  Normal speech and language. No gross focal neurologic deficits are appreciated.  Skin:  Skin is warm, dry and intact. No rash noted. Psychiatric: Mood and affect are normal. Speech and behavior are normal. Patient exhibits appropriate insight and judgement.   ____________________________________________   LABS (all labs ordered are listed, but only abnormal results are displayed)  Labs Reviewed  COMPREHENSIVE METABOLIC PANEL - Abnormal; Notable for the following components:      Result Value   Glucose, Bld 107 (*)    Alkaline Phosphatase 128 (*)    Total Bilirubin 2.0 (*)    All other components within normal limits  CBC - Abnormal; Notable for the following components:   WBC 10.9 (*)    All other components within normal limits  URINALYSIS, COMPLETE (UACMP) WITH MICROSCOPIC - Abnormal;  Notable for the following components:   Color, Urine AMBER (*)    APPearance HAZY (*)    Hgb urine dipstick SMALL (*)    Protein, ur 100 (*)    All other components within normal limits  SARS CORONAVIRUS 2 (HOSPITAL ORDER, [place] [facility])  LIPASE, BLOOD  TROPONIN I   ____________________________________________  EKG   ____________________________________________  RADIOLOGY I personally viewed and evaluated these images as part of my medical decision making, as well as reviewing the written report by the radiologist.    Dg Chest 1 View  Result Date: 05/02/2019 CLINICAL DATA:  Cough and nausea for 2 weeks EXAM: CHEST  1 VIEW COMPARISON:  12/28/2018 FINDINGS: Cardiac shadow remains enlarged. Postsurgical changes and pacing device are again seen and stable. The lungs are well aerated bilaterally. No focal infiltrate or sizable effusion is seen. No bony abnormality is noted. IMPRESSION: No acute abnormality seen. Electronically Signed   By: Inez Catalina M.D.   On: 05/02/2019 19:30   Ct Head Wo Contrast  Result Date: 05/02/2019 CLINICAL DATA:   Confusion EXAM: CT HEAD WITHOUT CONTRAST TECHNIQUE: Contiguous axial images were obtained from the base of the skull through the vertex without intravenous contrast. COMPARISON:  Head CT August 21, 2010 and brain MRI October 28, 2015. FINDINGS: Brain: There is age related volume loss. There is no intracranial mass, hemorrhage, extra-axial fluid collection, or midline shift. Small lacunar type infarcts in each cerebellar hemisphere appear stable. There is a small prior infarct in the head of the caudate nucleus on the right. There is minimal periventricular small vessel disease in the centra semiovale bilaterally. No acute infarct evident. Vascular: No hyperdense vessel. There is calcification in each carotid siphon region. Skull: Bony calvarium appears intact. Sinuses/Orbits: Visualized paranasal sinuses are clear. Visualized orbits appear symmetric bilaterally. Other: Mastoid air cells are clear. IMPRESSION: Age related volume loss with slight periventricular small vessel disease. Prior small infarct in the head of the caudate nucleus on the right. Small cerebellar lacunar type infarcts, stable. No acute infarct evident. No mass or hemorrhage. There are foci of arterial vascular calcification. Electronically Signed   By: Lowella Grip III M.D.   On: 05/02/2019 19:50    ____________________________________________    PROCEDURES  Procedure(s) performed:    Procedures    Medications - No data to display   ____________________________________________   INITIAL IMPRESSION / ASSESSMENT AND PLAN / ED COURSE  Pertinent labs & imaging results that were available during my care of the patient were reviewed by me and considered in my medical decision making (see chart for details).  Review of the [place] CSRS was performed in accordance of the [place] prior to dispensing any controlled drugs.          Assessment and Plan: Emesis 85 year old male presents to the emergency department with  nonproductive cough for 2 weeks, persistent left lower quadrant abdominal pain, emesis and new onset hallucinations that started last night.  On physical exam, patient is alert and oriented to time place and person.  His vital signs are reassuring in the emergency department.  He is afebrile.  He was not observed coughing during history.  He has tenderness to palpation in the left lower quadrant.  Differential diagnosis includes COVID-19, community-acquired pneumonia, posttussive emesis, bowel obstruction, abdominal wall strain.   Patient tested negative for COVID-19 in the emergency department.  Troponin was within reference range.  EKG revealed normal sinus rhythm without ST segment elevation.  Chest  x-ray reveals no evidence of pleural effusion, consolidation, opacities or infiltrates.  No leukocytosis on CBC. CMP is reassuring.  Abdominal exam revealed some mild left lower quadrant tenderness without guarding.  Patient has had recent bowel movement and is passing gas, low suspicion for bowel obstruction.  Patient had CT abdomen and pelvis on 04/21/2019 which was noncontributory for acute abnormality. Patient passed a p.o. challenge and consumed almost an entire food tray without emesis.  Patient has appropriate follow-up with his primary care physician this week.  All patient questions were answered prior to discharge.        ____________________________________________  FINAL CLINICAL IMPRESSION(S) / ED DIAGNOSES  Final diagnoses:  Non-intractable vomiting without nausea, unspecified vomiting type      NEW MEDICATIONS STARTED DURING THIS VISIT:  ED Discharge Orders    None          This chart was dictated using voice recognition software/Dragon. Despite best efforts to proofread, errors can occur which can change the meaning. Any change was purely unintentional.    Lannie Fields, PA-C 05/02/19 2105    Harvest Dark, MD 05/02/19 2240

## 2019-05-02 NOTE — ED Notes (Signed)
Patient transported to CT 

## 2019-05-02 NOTE — ED Triage Notes (Signed)
Pt c/o lower abd pain with N/v for the past 2 and half weeks. Last BM was yesterday denies any black or blood stools. States he had a CT 3 days ago and is suppose to go for a nuclear study next week.,

## 2019-05-02 NOTE — ED Provider Notes (Signed)
-----------------------------------------   8:56 PM on 05/02/2019 -----------------------------------------  Patient's work-up has been overall reassuring.  Negative troponin.  [place] test is negative.  Patient had a recent CT scan performed that was overall normal and reassuring.  I have personally seen and evaluated the patient, he appears well, he has eaten his entire food tray without any difficulty.  Patient states he will follow-up with his doctor this week.  I believe the patient is safe for discharge home at this time.   Harvest Dark, MD 05/02/19 2056

## 2019-05-02 NOTE — ED Notes (Signed)
Pt given food by tech Judson Roch

## 2019-05-02 NOTE — ED Notes (Signed)
Pt able to tolerate food/water

## 2019-05-18 DIAGNOSIS — I5022 Chronic systolic (congestive) heart failure: Secondary | ICD-10-CM | POA: Insufficient documentation

## 2019-05-29 ENCOUNTER — Other Ambulatory Visit
Admission: RE | Admit: 2019-05-29 | Discharge: 2019-05-29 | Disposition: A | Discharge status: Home / Self Care | Payer: Medicare Other | Source: Ambulatory Visit | Attending: Internal Medicine | Admitting: Internal Medicine

## 2019-05-29 ENCOUNTER — Other Ambulatory Visit: Payer: Self-pay

## 2019-05-29 DIAGNOSIS — Z1159 Encounter for screening for other viral diseases: Secondary | ICD-10-CM | POA: Diagnosis present

## 2019-05-30 LAB — NOVEL CORONAVIRUS, NAA (HOSP ORDER, SEND-OUT TO REF LAB; TAT 18-24 HRS): SARS-CoV-2, NAA: NOT DETECTED

## 2019-06-01 ENCOUNTER — Encounter: Payer: Self-pay | Admitting: Internal Medicine

## 2019-06-01 ENCOUNTER — Encounter
Admission: RE | Disposition: A | Discharge status: Home / Self Care | Payer: Self-pay | Source: Home / Self Care | Attending: Internal Medicine

## 2019-06-01 ENCOUNTER — Ambulatory Visit
Admission: RE | Admit: 2019-06-01 | Discharge: 2019-06-01 | Disposition: A | Discharge status: Home / Self Care | Payer: Medicare Other | Attending: Internal Medicine | Admitting: Internal Medicine

## 2019-06-01 ENCOUNTER — Other Ambulatory Visit: Payer: Self-pay

## 2019-06-01 DIAGNOSIS — I509 Heart failure, unspecified: Secondary | ICD-10-CM

## 2019-06-01 DIAGNOSIS — I5022 Chronic systolic (congestive) heart failure: Secondary | ICD-10-CM | POA: Diagnosis not present

## 2019-06-01 DIAGNOSIS — Z87891 Personal history of nicotine dependence: Secondary | ICD-10-CM | POA: Insufficient documentation

## 2019-06-01 DIAGNOSIS — Z7982 Long term (current) use of aspirin: Secondary | ICD-10-CM | POA: Insufficient documentation

## 2019-06-01 DIAGNOSIS — Z951 Presence of aortocoronary bypass graft: Secondary | ICD-10-CM | POA: Insufficient documentation

## 2019-06-01 DIAGNOSIS — I25708 Atherosclerosis of coronary artery bypass graft(s), unspecified, with other forms of angina pectoris: Secondary | ICD-10-CM | POA: Diagnosis present

## 2019-06-01 DIAGNOSIS — E785 Hyperlipidemia, unspecified: Secondary | ICD-10-CM | POA: Insufficient documentation

## 2019-06-01 DIAGNOSIS — Z79899 Other long term (current) drug therapy: Secondary | ICD-10-CM | POA: Diagnosis not present

## 2019-06-01 DIAGNOSIS — Z950 Presence of cardiac pacemaker: Secondary | ICD-10-CM | POA: Insufficient documentation

## 2019-06-01 DIAGNOSIS — I495 Sick sinus syndrome: Secondary | ICD-10-CM | POA: Insufficient documentation

## 2019-06-01 DIAGNOSIS — I714 Abdominal aortic aneurysm, without rupture: Secondary | ICD-10-CM | POA: Diagnosis not present

## 2019-06-01 DIAGNOSIS — I110 Hypertensive heart disease with heart failure: Secondary | ICD-10-CM | POA: Diagnosis not present

## 2019-06-01 DIAGNOSIS — Z1159 Encounter for screening for other viral diseases: Secondary | ICD-10-CM | POA: Insufficient documentation

## 2019-06-01 DIAGNOSIS — I4891 Unspecified atrial fibrillation: Secondary | ICD-10-CM | POA: Insufficient documentation

## 2019-06-01 DIAGNOSIS — F419 Anxiety disorder, unspecified: Secondary | ICD-10-CM | POA: Diagnosis not present

## 2019-06-01 DIAGNOSIS — K219 Gastro-esophageal reflux disease without esophagitis: Secondary | ICD-10-CM | POA: Insufficient documentation

## 2019-06-01 DIAGNOSIS — I208 Other forms of angina pectoris: Secondary | ICD-10-CM | POA: Diagnosis present

## 2019-06-01 HISTORY — PX: RIGHT HEART CATH AND CORONARY/GRAFT ANGIOGRAPHY: CATH118265

## 2019-06-01 SURGERY — RIGHT HEART CATH AND CORONARY/GRAFT ANGIOGRAPHY
Anesthesia: Moderate Sedation

## 2019-06-01 MED ORDER — SODIUM CHLORIDE 0.9% FLUSH: 3.0000 mL | INTRAVENOUS | Status: DC | PRN

## 2019-06-01 MED ORDER — FENTANYL CITRATE (PF) 100 MCG/2ML IJ SOLN
INTRAMUSCULAR | Status: DC | PRN
  Administered 2019-06-01: 25 ug via INTRAVENOUS

## 2019-06-01 MED ORDER — SODIUM CHLORIDE 0.9 % WEIGHT BASED INFUSION: 3.0000 mL/kg/h | INTRAVENOUS | Status: AC

## 2019-06-01 MED ORDER — HEPARIN (PORCINE) IN NACL 1000-0.9 UT/500ML-% IV SOLN
INTRAVENOUS | Status: DC | PRN
  Administered 2019-06-01: 500 mL

## 2019-06-01 MED ORDER — IOHEXOL 300 MG/ML  SOLN
INTRAMUSCULAR | Status: DC | PRN
  Administered 2019-06-01: 200 mL via INTRA_ARTERIAL

## 2019-06-01 MED ORDER — SODIUM CHLORIDE 0.9 % WEIGHT BASED INFUSION: 1.0000 mL/kg/h | INTRAVENOUS | Status: DC

## 2019-06-01 MED ORDER — MIDAZOLAM HCL 2 MG/2ML IJ SOLN
INTRAMUSCULAR | Status: AC
  Filled 2019-06-01: qty 2

## 2019-06-01 MED ORDER — SODIUM CHLORIDE 0.9% FLUSH: 3.0000 mL | Freq: Two times a day (BID) | INTRAVENOUS | Status: DC

## 2019-06-01 MED ORDER — SODIUM CHLORIDE 0.9 % IV SOLN: 250.0000 mL | INTRAVENOUS | Status: DC | PRN

## 2019-06-01 MED ORDER — ASPIRIN 81 MG PO CHEW: 81.0000 mg | CHEWABLE_TABLET | ORAL | Status: DC

## 2019-06-01 MED ORDER — MIDAZOLAM HCL 2 MG/2ML IJ SOLN
INTRAMUSCULAR | Status: DC | PRN
  Administered 2019-06-01: 1 mg via INTRAVENOUS

## 2019-06-01 MED ORDER — FENTANYL CITRATE (PF) 100 MCG/2ML IJ SOLN
INTRAMUSCULAR | Status: AC
  Filled 2019-06-01: qty 2

## 2019-06-01 MED ORDER — HEPARIN (PORCINE) IN NACL 1000-0.9 UT/500ML-% IV SOLN
INTRAVENOUS | Status: AC
  Filled 2019-06-01: qty 1000

## 2019-06-01 SURGICAL SUPPLY — 17 items
CATH AMP RT 5F (CATHETERS) ×2 IMPLANT
CATH INFINITI 5F MPA2 125 (CATHETERS) ×2 IMPLANT
CATH INFINITI 5FR AL1 (CATHETERS) ×2 IMPLANT
CATH INFINITI 5FR ANG PIGTAIL (CATHETERS) ×2 IMPLANT
CATH INFINITI 5FR JL4 (CATHETERS) ×2 IMPLANT
CATH INFINITI JR4 5F (CATHETERS) ×2 IMPLANT
CATH SWANZ 7F THERMO (CATHETERS) ×2 IMPLANT
DEVICE CLOSURE MYNXGRIP 5F (Vascular Products) ×2 IMPLANT
GUIDEWIRE EMER 3M J .025X150CM (WIRE) ×2 IMPLANT
KIT MANI 3VAL PERCEP (MISCELLANEOUS) ×4 IMPLANT
KIT RIGHT HEART (MISCELLANEOUS) ×4 IMPLANT
NDL PERC 18GX7CM (NEEDLE) IMPLANT
NEEDLE PERC 18GX7CM (NEEDLE) ×4 IMPLANT
PACK CARDIAC CATH (CUSTOM PROCEDURE TRAY) ×4 IMPLANT
SHEATH AVANTI 5FR X 11CM (SHEATH) ×2 IMPLANT
SHEATH AVANTI 7FRX11 (SHEATH) ×2 IMPLANT
WIRE GUIDERIGHT .035X150 (WIRE) ×2 IMPLANT

## 2019-06-28 ENCOUNTER — Other Ambulatory Visit: Payer: Self-pay | Admitting: Gastroenterology

## 2019-06-28 DIAGNOSIS — R7989 Other specified abnormal findings of blood chemistry: Secondary | ICD-10-CM

## 2019-08-16 ENCOUNTER — Ambulatory Visit: Admission: RE | Admit: 2019-08-16 | Payer: Medicare Other | Source: Ambulatory Visit

## 2019-08-17 ENCOUNTER — Other Ambulatory Visit: Payer: Self-pay

## 2019-08-17 ENCOUNTER — Encounter (INDEPENDENT_AMBULATORY_CARE_PROVIDER_SITE_OTHER): Payer: Self-pay

## 2019-08-17 ENCOUNTER — Ambulatory Visit (INDEPENDENT_AMBULATORY_CARE_PROVIDER_SITE_OTHER): Payer: Medicare Other

## 2019-08-17 ENCOUNTER — Ambulatory Visit (INDEPENDENT_AMBULATORY_CARE_PROVIDER_SITE_OTHER): Payer: Medicare Other | Admitting: Nurse Practitioner

## 2019-08-17 ENCOUNTER — Encounter (INDEPENDENT_AMBULATORY_CARE_PROVIDER_SITE_OTHER): Payer: Self-pay | Admitting: Nurse Practitioner

## 2019-08-17 DIAGNOSIS — I714 Abdominal aortic aneurysm, without rupture, unspecified: Secondary | ICD-10-CM

## 2019-08-17 DIAGNOSIS — I712 Thoracic aortic aneurysm, without rupture, unspecified: Secondary | ICD-10-CM | POA: Insufficient documentation

## 2019-08-17 DIAGNOSIS — Z87442 Personal history of urinary calculi: Secondary | ICD-10-CM | POA: Insufficient documentation

## 2019-08-18 ENCOUNTER — Other Ambulatory Visit (INDEPENDENT_AMBULATORY_CARE_PROVIDER_SITE_OTHER): Payer: Self-pay | Admitting: Vascular Surgery

## 2019-08-18 DIAGNOSIS — I714 Abdominal aortic aneurysm, without rupture, unspecified: Secondary | ICD-10-CM

## 2019-08-23 ENCOUNTER — Encounter (INDEPENDENT_AMBULATORY_CARE_PROVIDER_SITE_OTHER): Payer: Self-pay | Admitting: Nurse Practitioner

## 2019-09-06 NOTE — Progress Notes (Signed)
Patient not seen, sent letter

## 2020-03-06 NOTE — Progress Notes (Signed)
[facility]  [address], [address] [place], [place] [address] Phone: [phone]  Fax: [phone]   Clinic Day:  03/08/2020  Referring physician: Valera Castle, *  Chief Complaint: EIAN Sanford is a 86 y.o. male with a left breast lump who is referred in consultation with Dr. Johny Drilling for assessment and management.   HPI: The patient was seen by Dr Kym Groom on 03/04/2020.  He was noted to have 2 lumps on the left side of his chest and shoulder. One area was painful but the other was only noticeable when touched.  He noted that the lump on the left breast was tender. He recently noticed the lump on the chest below the clavicle. Exam revealed fibronodular tissue bilaterally. There was a small tender mass around 5:00 from the nipple.  Symptomatically, he feels "good" today. He notes that his lump on his left breast swells up and it can get sore when it swells up; symptoms began 2-3 months ago. The soreness fluctuates but the lump never goes away. He denies any falls or trauma. His other lump was due to his pacemaker; noticeable with weight loss. His wife notes he has intentionally lost 26 lb over the last two years. He diets 2 days out of the week, Mondays and Thursdays. He weighed 226 pounds when he decided he wanted to lose weight. He eats 600 calories a day when he diets. He notes that his neighbors are the ones who recommended his 2 day a week diet.   He denies any nausea or vomiting. He denies any breast skin changes.  He denies any nipple discharge. He has sinus issues; he notes pollen can cause him to have headaches. He sometimes get diarrhea but it is not severe. His wife notes he has reflux but he takes medicine for it. He frequently urinates. He has arthritis in his hands. He has trouble with balance, his cardiologist recommended he get a cane. He says nobody in his family has a history of cancer. He denies being on any new medications other than his  cardiologist replacing some of his medications that he was already taking. He was on a couple of medications that he was taking for a short period of time due to pain; he is unable to recall the names of the medications.   Physical exam revealed left breast tenderness at site of superficial lump between 12 o'clock and 3 o'clock; a little diffused. Slight lateral fibrocystic disease on right breast.    Past Medical History:  Diagnosis Date  . Aneurysm ([place])   . Coronary artery disease   . GERD (gastroesophageal reflux disease)   . History of kidney stones   . HLD (hyperlipidemia)   . Hypertension     Past Surgical History:  Procedure Laterality Date  . APPENDECTOMY    . CARDIAC CATHETERIZATION    . CARDIAC SURGERY    . CHOLECYSTECTOMY    . CORONARY ANGIOPLASTY    . CORONARY ARTERY BYPASS GRAFT    . MASTOIDECTOMY    . PACEMAKER INSERTION Left 07/26/2018   Procedure: INSERTION PACEMAKER-SINGLE CHAMBER INITIAL INSERT;  Surgeon: Isaias Cowman, MD;  Location: [facility] ORS;  Service: Cardiovascular;  Laterality: Left;  . RIGHT HEART CATH AND CORONARY/GRAFT ANGIOGRAPHY N/A 06/01/2019   Procedure: RIGHT HEART CATH AND CORONARY/GRAFT ANGIOGRAPHY;  Surgeon: Corey Skains, MD;  Location: [place] [facility];  Service: Cardiovascular;  Laterality: N/A;  . TONSILLECTOMY      Family History  Problem  Relation Age of Onset  . Cancer Mother   . Heart disease Mother   . Heart attack Father   . Heart disease Father     Social History:  reports that he has quit smoking. His smoking use included cigarettes. He quit smoking tobacco when he was 86 years old. He reports current alcohol use. He reports that he does not use drugs. 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 He previously lived in [place], [place] then [place].  He has been back in [place] x 9 years.  The patient lives with his wife, [name]. The patient is accompanied by his wife on the Ipad  today.  Allergies:  Allergies  Allergen Reactions  . Crab [Shellfish Allergy] Swelling    Lips/fingers--whole body swelling (caused hospitalized) CRABS ONLY  . Penicillins Itching, Swelling and Other (See Comments)    Has patient had a PCN reaction causing immediate rash, facial/tongue/throat swelling, SOB or lightheadedness with hypotension: Yes Has patient had a PCN reaction causing severe rash involving mucus membranes or skin necrosis: No Has patient had a PCN reaction that required hospitalization No Has patient had a PCN reaction occurring within the last 10 years: No If all of the above answers are "NO", then may proceed with Cephalosporin use.    Current Medications: Current Outpatient Medications  Medication Sig Dispense Refill  . acetaminophen (TYLENOL) 500 MG tablet Take 500 mg by mouth every 6 (six) hours as needed (for pain.).    Marland Kitchen amLODipine (NORVASC) 5 MG tablet Take 5 mg by mouth daily.    Marland Kitchen aspirin EC 81 MG tablet Take 81 mg by mouth daily.    Marland Kitchen atorvastatin (LIPITOR) 20 MG tablet     . famotidine (PEPCID) 20 MG tablet Take 20 mg by mouth 2 (two) times daily.    . furosemide (LASIX) 20 MG tablet Take by mouth.    . losartan (COZAAR) 50 MG tablet Take by mouth.    . metoprolol succinate (TOPROL-XL) 25 MG 24 hr tablet Take 25 mg by mouth daily.     No current facility-administered medications for this visit.    Review of Systems  Constitutional: Negative for chills, diaphoresis, fever, malaise/fatigue and weight loss (26 lb intentional weight loss over 2 years).       Feels "good".  HENT: Positive for congestion (runny nose expecially especially when around pollen). Negative for ear pain, nosebleeds, sinus pain and sore throat.   Eyes: Negative.  Negative for blurred vision, double vision and photophobia.  Respiratory: Negative.  Negative for cough, sputum production, shortness of breath, wheezing and stridor.   Cardiovascular: Negative.  Negative for chest pain,  palpitations, orthopnea and leg swelling.  Gastrointestinal: Positive for diarrhea (occasional). Negative for abdominal pain, blood in stool, constipation, melena, nausea and vomiting.       Reflux, controlled with medication.  Genitourinary: Positive for frequency. Negative for dysuria and urgency.  Musculoskeletal: Positive for joint pain (arthritis in hands). Negative for back pain, falls and myalgias.  Skin: Negative.  Negative for itching and rash.  Neurological: Positive for headaches. Negative for dizziness, tingling, sensory change, speech change, focal weakness and weakness.       Requires cane for balance.  Psychiatric/Behavioral: Negative.  Negative for depression and memory loss. The patient is not nervous/anxious and does not have insomnia.   All other systems reviewed and are negative.  Performance status (ECOG):  1  Vitals Blood pressure (!) 112/59, pulse  84, temperature (!) 97.3 F (36.3 C), temperature source Tympanic, resp. rate (!) 0, height 6\' 2"  (1.88 m), weight 185 lb (83.9 kg), SpO2 98 %.   Physical Exam  Constitutional: He is oriented to person, place, and time. He appears well-developed and well-nourished. No distress. Face mask in place.  He has a cane by his side.  HENT:  Head: Normocephalic and atraumatic.  Mouth/Throat: Oropharynx is clear and moist and mucous membranes are normal. No oral lesions.  Short gray hair.  Eyes: Pupils are equal, round, and reactive to light. Conjunctivae and EOM are normal. No scleral icterus.  Neck: No JVD present.  Cardiovascular: Normal rate, regular rhythm and normal heart sounds. Exam reveals no gallop and no friction rub.  No murmur heard. Pulmonary/Chest: Effort normal and breath sounds normal. He has no wheezes. He has no rhonchi. He has no rales.  Pacemaker upper left chest.  Left breast fullness with associated tenderness between 12 o'clock and 3 o'clock.  Right breast with lateral fibrocystic changes.  Abdominal: Soft.  Normal appearance and bowel sounds are normal. He exhibits no distension and no mass. There is no hepatosplenomegaly. There is no abdominal tenderness. There is no rebound, no guarding and no CVA tenderness.  Musculoskeletal:        General: No tenderness or edema. Normal range of motion.     Cervical back: Normal range of motion and neck supple.  Lymphadenopathy:       Head (right side): No preauricular, no posterior auricular and no occipital adenopathy present.       Head (left side): No preauricular, no posterior auricular and no occipital adenopathy present.    He has no cervical adenopathy.    He has no axillary adenopathy.       Right: No inguinal and no supraclavicular adenopathy present.       Left: No inguinal and no supraclavicular adenopathy present.  Neurological: He is alert and oriented to person, place, and time.  Skin: Skin is warm, dry and intact. No bruising, no lesion and no rash noted. He is not diaphoretic. No erythema. No pallor.  Psychiatric: He has a normal mood and affect. His behavior is normal. Judgment and thought content normal.  Nursing note and vitals reviewed.   No visits with results within 3 Day(s) from this visit.  Latest known visit with results is:  Hospital Outpatient Visit on 05/29/2019  Component Date Value Ref Range Status  . SARS-CoV-2, NAA 05/29/2019 NOT DETECTED  NOT DETECTED Final   Comment: (NOTE) This test was developed and its performance characteristics determined by [employer]. This test has not been FDA cleared or approved. This test has been authorized by FDA under an Emergency Use Authorization (EUA). This test is only authorized for the duration of time the declaration that circumstances exist justifying the authorization of the emergency use of in vitro diagnostic tests for detection of SARS-CoV-2 virus and/or diagnosis of COVID-19 infection under section 564(b)(1) of the Act, 21 U.S.C. [id number]), unless the  authorization is terminated or revoked sooner. When diagnostic testing is negative, the possibility of a false negative result should be considered in the context of a patient's recent exposures and the presence of clinical signs and symptoms consistent with COVID-19. An individual without symptoms of COVID-19 and who is not shedding SARS-CoV-2 virus would expect to have a negative (not detected) result in this assay. Performed  At: [facility] [place], [place] [id number] Rush Farmer MD [id number]   . Coronavirus Source 05/29/2019 NASOPHARYNGEAL   Final   Performed at [facility], [address] [place], [place] [address]    Assessment:  [name] is a 86 y.o. male with left breast mass.  He noted left breast swelling 2 months ago.  He denies any trauma.  He denies any new medication.  He denies any exposure to radiation.  He denies any family history of breast cancer.  Symptomatically, he notes left breast swelling and tenderness.  Exam reveals tender fullness in the left breast between 12 o'clock and 3 o'clock.  He has no palpable adenopathy.  Plan: 1.   Left breast mass  Etiology unclear.  Possible fibrocystic changes or gynecomastia.  Discuss plans for bilateral mammogram and ultrasound. 2.   Bilateral mammogram with left breast ultrasound. 3.   RTC after mammogram for MD assess and review of imaging.  I discussed the assessment and treatment plan with the patient.  The patient was provided an opportunity to ask questions and all were answered.  The patient agreed with the plan and demonstrated an understanding of the instructions.  The patient was advised to call back if the symptoms worsen or if the condition fails to improve as anticipated.  I provided 22 minutes (1:46 PM - 2:08 PM) of face-to-face time during this this encounter and > 50% was spent counseling as documented under my assessment and plan.  An  additional 8-10 minutes were spent reviewing his chart (Epic and Care Everywhere) including notes, labs, and imaging studies.    Akili Corsetti C. Mike Gip, MD, PhD    03/08/2020, 1:46 PM  I, Heywood Footman, am acting as Education administrator for [employer]. Mike Gip, MD, PhD.  I, Shakeila Pfarr C. Mike Gip, MD, have reviewed the above documentation for accuracy and completeness, and I agree with the above.

## 2020-03-08 ENCOUNTER — Encounter: Payer: Self-pay | Admitting: Hematology and Oncology

## 2020-03-08 ENCOUNTER — Other Ambulatory Visit: Payer: Self-pay

## 2020-03-08 ENCOUNTER — Inpatient Hospital Stay: Payer: Medicare Other | Attending: Hematology and Oncology | Admitting: Hematology and Oncology

## 2020-03-08 ENCOUNTER — Inpatient Hospital Stay: Payer: Medicare Other

## 2020-03-08 VITALS — BP 112/59 | HR 84 | Temp 97.3°F | Resp 0 | Ht 74.0 in | Wt 185.0 lb

## 2020-03-08 DIAGNOSIS — N6320 Unspecified lump in the left breast, unspecified quadrant: Secondary | ICD-10-CM | POA: Diagnosis not present

## 2020-03-08 DIAGNOSIS — K219 Gastro-esophageal reflux disease without esophagitis: Secondary | ICD-10-CM | POA: Insufficient documentation

## 2020-03-08 DIAGNOSIS — R197 Diarrhea, unspecified: Secondary | ICD-10-CM | POA: Insufficient documentation

## 2020-03-08 DIAGNOSIS — Z87442 Personal history of urinary calculi: Secondary | ICD-10-CM | POA: Insufficient documentation

## 2020-03-08 DIAGNOSIS — Z79899 Other long term (current) drug therapy: Secondary | ICD-10-CM | POA: Insufficient documentation

## 2020-03-08 DIAGNOSIS — I2510 Atherosclerotic heart disease of native coronary artery without angina pectoris: Secondary | ICD-10-CM | POA: Insufficient documentation

## 2020-03-08 DIAGNOSIS — I10 Essential (primary) hypertension: Secondary | ICD-10-CM | POA: Diagnosis not present

## 2020-03-08 DIAGNOSIS — E785 Hyperlipidemia, unspecified: Secondary | ICD-10-CM | POA: Diagnosis not present

## 2020-03-08 DIAGNOSIS — Z809 Family history of malignant neoplasm, unspecified: Secondary | ICD-10-CM | POA: Insufficient documentation

## 2020-03-08 DIAGNOSIS — Z7982 Long term (current) use of aspirin: Secondary | ICD-10-CM | POA: Diagnosis not present

## 2020-03-08 DIAGNOSIS — D4959 Neoplasm of unspecified behavior of other genitourinary organ: Secondary | ICD-10-CM | POA: Diagnosis not present

## 2020-03-08 DIAGNOSIS — F17210 Nicotine dependence, cigarettes, uncomplicated: Secondary | ICD-10-CM | POA: Diagnosis not present

## 2020-03-11 ENCOUNTER — Other Ambulatory Visit: Payer: Self-pay | Admitting: Hematology and Oncology

## 2020-03-11 DIAGNOSIS — N6320 Unspecified lump in the left breast, unspecified quadrant: Secondary | ICD-10-CM

## 2020-03-19 ENCOUNTER — Ambulatory Visit
Admission: RE | Admit: 2020-03-19 | Discharge: 2020-03-19 | Disposition: A | Discharge status: Home / Self Care | Payer: Medicare Other | Source: Ambulatory Visit | Attending: Hematology and Oncology | Admitting: Hematology and Oncology

## 2020-03-19 DIAGNOSIS — N6320 Unspecified lump in the left breast, unspecified quadrant: Secondary | ICD-10-CM | POA: Diagnosis not present

## 2020-03-19 NOTE — Progress Notes (Signed)
[facility]  [address], [address] [place], [place] [address] Phone: [phone]  Fax: [phone]   Clinic Day:  03/20/2020  Referring physician: Valera Sanford, *  Chief Complaint: [name] is a 86 y.o. male with a left breast lump who is seen for review of interval mammogram and discussion regarding direction of therapy.   HPI: The patient was last seen in the medical oncology clinic on 03/08/2020. At that time, he was seen for a new patient assessment. He noted that the lump on his left breast that would frequently swell and become sore.  Lump had been present for 2-3 months.  Bilateral mammogram on 03/19/2020 revealed gynecomastia (left > right).  There was no evidence of malignancy.  Symptomatically, he feels good after hearing the lump in his breast is not cancerous. He thinks the lump has been there for around 6 months but he didn't start paying attention to it until 2-3 months ago. He notes he has been taking Lipitor for a long time. He denies sweats. He denies any testicular swelling. He says he does not think he has any kidney issues. He would like his lab results to be directly sent to Dr. Jefm Sanford.    Past Medical History:  Diagnosis Date  . Aneurysm ([place])   . Coronary artery disease   . GERD (gastroesophageal reflux disease)   . History of kidney stones   . HLD (hyperlipidemia)   . Hypertension     Past Surgical History:  Procedure Laterality Date  . APPENDECTOMY    . CARDIAC CATHETERIZATION    . CARDIAC SURGERY    . CHOLECYSTECTOMY    . CORONARY ANGIOPLASTY    . CORONARY ARTERY BYPASS GRAFT    . MASTOIDECTOMY    . PACEMAKER INSERTION Left 07/26/2018   Procedure: INSERTION PACEMAKER-SINGLE CHAMBER INITIAL INSERT;  Surgeon: Isaias Cowman, MD;  Location: [facility] ORS;  Service: Cardiovascular;  Laterality: Left;  . RIGHT HEART CATH AND CORONARY/GRAFT ANGIOGRAPHY N/A 06/01/2019   Procedure: RIGHT HEART CATH AND CORONARY/GRAFT  ANGIOGRAPHY;  Surgeon: Corey Skains, MD;  Location: [place] [facility];  Service: Cardiovascular;  Laterality: N/A;  . TONSILLECTOMY      Family History  Problem Relation Age of Onset  . Cancer Mother   . Heart disease Mother   . Heart attack Father   . Heart disease Father     Social History:  reports that he has quit smoking. His smoking use included cigarettes. He has never used smokeless tobacco. He reports current alcohol use. He reports that he does not use drugs. 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 The patient lives with his wife, [name]. The patient is alone  today.  Allergies:  Allergies  Allergen Reactions  . Crab [Shellfish Allergy] Swelling    Lips/fingers--whole body swelling (caused hospitalized) CRABS ONLY  . Penicillins Itching, Swelling and Other (See Comments)    Has patient had a PCN reaction causing immediate rash, facial/tongue/throat swelling, SOB or lightheadedness with hypotension: Yes Has patient had a PCN reaction causing severe rash involving mucus membranes or skin necrosis: No Has patient had a PCN reaction that required hospitalization No Has patient had a PCN reaction occurring within the last 10 years: No If all of the above answers are "NO", then may proceed with Cephalosporin use.    Current Medications: Current Outpatient Medications  Medication Sig Dispense Refill  . acetaminophen (TYLENOL) 500 MG tablet Take 500  mg by mouth every 6 (six) hours as needed (for pain.).    Marland Kitchen amLODipine (NORVASC) 5 MG tablet Take 5 mg by mouth daily.    Marland Kitchen aspirin EC 81 MG tablet Take 81 mg by mouth daily.    Marland Kitchen atorvastatin (LIPITOR) 20 MG tablet     . famotidine (PEPCID) 20 MG tablet Take 20 mg by mouth 2 (two) times daily.    . metoprolol succinate (TOPROL-XL) 25 MG 24 hr tablet Take 25 mg by mouth daily.     No current facility-administered medications for this visit.    Review of Systems  Constitutional: Positive for  weight loss (26 lb over 2 years). Negative for chills, diaphoresis, fever and malaise/fatigue.  HENT: Negative for congestion, ear pain and sore throat.   Eyes: Negative for blurred vision and double vision.  Respiratory: Negative for cough, shortness of breath and wheezing.   Cardiovascular: Negative for chest pain, palpitations and leg swelling.  Gastrointestinal: Positive for diarrhea (occasional ). Negative for abdominal pain, blood in stool, constipation, melena, nausea and vomiting.       Reflux but controlled with medication  Genitourinary: Positive for frequency. Negative for dysuria and urgency.  Musculoskeletal: Positive for joint pain (arthritis in hands). Negative for back pain, falls and myalgias.  Skin: Negative for itching and rash.  Neurological: Positive for weakness (unble to stay balanced without a cane) and headaches. Negative for dizziness and sensory change.  Endo/Heme/Allergies: Does not bruise/bleed easily.  Psychiatric/Behavioral: Negative for depression and memory loss. The patient is not nervous/anxious and does not have insomnia.   All other systems reviewed and are negative.  Performance status (ECOG): 0 - Asymptomatic  Vitals Blood pressure (!) 105/58, pulse (!) 56, temperature (!) 95.6 F (35.3 C), temperature source Tympanic, resp. rate 18, height 6\' 2"  (1.88 m), weight 187 lb (84.8 kg), SpO2 98 %.   Physical Exam Vitals and nursing note reviewed.  Constitutional:      General: He is not in acute distress.    Appearance: He is well-developed and well-nourished.     Interventions: Face mask in place.     Comments: He has a cane by his side.  HENT:     Head: Normocephalic and atraumatic.      Comments: Short gray hair. Eyes:     General: No scleral icterus.    Extraocular Movements: EOM normal.     Conjunctiva/sclera: Conjunctivae normal.  Neurological:     Mental Status: He is alert and oriented to person, place, and time.  Psychiatric:         Mood and Affect: Mood and affect normal.        Behavior: Behavior normal.        Thought Content: Thought content normal.        Judgment: Judgment normal.    Orders Only on 03/20/2020  Component Date Value Ref Range Status  . Bilirubin, Direct 03/20/2020 0.3* 0.0 - 0.2 mg/dL Final   Performed at [facility], [address], [place], [place] [address]  Office Visit on 03/20/2020  Component Date Value Ref Range Status  . Sodium 03/20/2020 139  135 - 145 mmol/L Final  . Potassium 03/20/2020 3.8  3.5 - 5.1 mmol/L Final  . Chloride 03/20/2020 102  98 - 111 mmol/L Final  . CO2 03/20/2020 27  22 - 32 mmol/L Final  . Glucose, Bld 03/20/2020 151* 70 - 99 mg/dL Final   Glucose reference range applies only to samples taken  after fasting for at least 8 hours.  . BUN 03/20/2020 21  8 - 23 mg/dL Final  . Creatinine, Ser 03/20/2020 1.23  0.61 - 1.24 mg/dL Final  . Calcium 03/20/2020 9.2  8.9 - 10.3 mg/dL Final  . Total Protein 03/20/2020 7.5  6.5 - 8.1 g/dL Final  . Albumin 03/20/2020 4.4  3.5 - 5.0 g/dL Final  . AST 03/20/2020 21  15 - 41 U/L Final  . ALT 03/20/2020 14  0 - 44 U/L Final  . Alkaline Phosphatase 03/20/2020 80  38 - 126 U/L Final  . Total Bilirubin 03/20/2020 1.4* 0.3 - 1.2 mg/dL Final  . GFR calc non Af Amer 03/20/2020 54* >60 mL/min Final  . GFR calc Af Amer 03/20/2020 >60  >60 mL/min Final  . Anion gap 03/20/2020 10  5 - 15 Final   Performed at [facility], [address] [place], [place] [address]  . hCG Quant 03/20/2020 0  0 - 3 mIU/mL Final   Comment: (NOTE) Roche ECLIA methodology Performed At: [facility] [place], [place] [id number] Rush Farmer MD [id number]   . LH 03/20/2020 8.6  1.7 - 8.6 mIU/mL Final   Comment: (NOTE) Performed At: [facility] [place], [place] [id number] Rush Farmer MD [id number]   . Estradiol 03/20/2020 20.4  7.6 - 42.6 pg/mL Final   Comment:  (NOTE) Roche ECLIA methodology Performed At: [facility] [address] [place], [place] [id number] Rush Farmer MD [id number]   . Testosterone 03/20/2020 276  264 - 916 ng/dL Final   Comment: (NOTE) Adult male reference interval is based on a population of healthy nonobese males (BMI <30) between 48 and 53 years old. [place], [place] [id number]. PMID: [id number]. Performed At: [facility] [address] [place], [place] [id number] Rush Farmer MD [id number]     Assessment:  [name] is a 86 y.o. male with left breast mass.  He noted left breast swelling 2 months ago.  He denies any trauma.  He denies any new medication.  He denies any exposure to radiation.  He denies any family history of breast cancer.  Bilateral diagnostic mammogram on 03/19/2020 revealed gynecomastia (left > right).  There was no evidence of malignancy.  Symptomatically, he denies any concerns.  Plan: 1.   Labs today:  testosterone, estradiol, HCG, LH.   2.   Gynecomastia  Review interval mammogram.   No evidence of malignancy.  Etiology unclear.   No medications implicated except for atorvastatin.  Discuss laboratory work-up. 3.  RN to call patient with results. 4.  RTC prn.  I discussed the assessment and treatment plan with the patient.  The patient was provided an opportunity to ask questions and all were answered.  The patient agreed with the plan and demonstrated an understanding of the instructions.  The patient was advised to call back if the symptoms worsen or if the condition fails to improve as anticipated.   Lequita Asal, MD, PhD    03/20/2020, 1:23 PM  I, Heywood Footman, am acting as Education administrator for [employer]. Mike Gip, MD, PhD.  I, Bryceson Grape C. Mike Gip, MD, have reviewed the above documentation for accuracy and completeness, and I agree with the above.

## 2020-03-20 ENCOUNTER — Inpatient Hospital Stay (HOSPITAL_BASED_OUTPATIENT_CLINIC_OR_DEPARTMENT_OTHER): Payer: Medicare Other | Admitting: Hematology and Oncology

## 2020-03-20 ENCOUNTER — Other Ambulatory Visit: Payer: Self-pay

## 2020-03-20 ENCOUNTER — Ambulatory Visit: Payer: Medicare Other

## 2020-03-20 ENCOUNTER — Encounter: Payer: Self-pay | Admitting: Hematology and Oncology

## 2020-03-20 VITALS — BP 105/58 | HR 56 | Temp 95.6°F | Resp 18 | Ht 74.0 in | Wt 187.0 lb

## 2020-03-20 DIAGNOSIS — N6320 Unspecified lump in the left breast, unspecified quadrant: Secondary | ICD-10-CM | POA: Diagnosis not present

## 2020-03-20 DIAGNOSIS — N62 Hypertrophy of breast: Secondary | ICD-10-CM

## 2020-03-20 LAB — COMPREHENSIVE METABOLIC PANEL
ALT: 14 U/L (ref 0–44)
AST: 21 U/L (ref 15–41)
Albumin: 4.4 g/dL (ref 3.5–5.0)
Alkaline Phosphatase: 80 U/L (ref 38–126)
Anion gap: 10 (ref 5–15)
BUN: 21 mg/dL (ref 8–23)
CO2: 27 mmol/L (ref 22–32)
Calcium: 9.2 mg/dL (ref 8.9–10.3)
Chloride: 102 mmol/L (ref 98–111)
Creatinine, Ser: 1.23 mg/dL (ref 0.61–1.24)
GFR calc Af Amer: >60 mL/min (ref >60)
GFR calc non Af Amer: 54 mL/min — ABNORMAL LOW (ref >60)
Glucose, Bld: 151 mg/dL — ABNORMAL HIGH (ref 70–99)
Potassium: 3.8 mmol/L (ref 3.5–5.1)
Sodium: 139 mmol/L (ref 135–145)
Total Bilirubin: 1.4 mg/dL — ABNORMAL HIGH (ref 0.3–1.2)
Total Protein: 7.5 g/dL (ref 6.5–8.1)

## 2020-03-20 LAB — BILIRUBIN, DIRECT: Bilirubin, Direct: 0.3 mg/dL — ABNORMAL HIGH (ref 0.0–0.2)

## 2020-03-20 NOTE — Progress Notes (Signed)
No new changes noted today 

## 2020-03-21 LAB — BETA HCG QUANT (REF LAB): hCG Quant: 0 m[IU]/mL (ref 0–3)

## 2020-03-21 LAB — LUTEINIZING HORMONE: LH: 8.6 m[IU]/mL (ref 1.7–8.6)

## 2020-03-21 LAB — TESTOSTERONE: Testosterone: 276 ng/dL (ref 264–916)

## 2020-03-21 LAB — ESTRADIOL: Estradiol: 20.4 pg/mL (ref 7.6–42.6)

## 2020-08-13 ENCOUNTER — Ambulatory Visit (INDEPENDENT_AMBULATORY_CARE_PROVIDER_SITE_OTHER): Payer: Medicare Other | Admitting: Vascular Surgery

## 2020-08-13 ENCOUNTER — Other Ambulatory Visit (INDEPENDENT_AMBULATORY_CARE_PROVIDER_SITE_OTHER): Payer: Medicare Other

## 2021-02-18 ENCOUNTER — Emergency Department
Admission: EM | Admit: 2021-02-18 | Discharge: 2021-02-18 | Disposition: A | Discharge status: Home / Self Care | Payer: Medicare Other | Attending: Emergency Medicine | Admitting: Emergency Medicine

## 2021-02-18 ENCOUNTER — Emergency Department: Payer: Medicare Other

## 2021-02-18 ENCOUNTER — Encounter: Payer: Self-pay | Admitting: Emergency Medicine

## 2021-02-18 DIAGNOSIS — Z951 Presence of aortocoronary bypass graft: Secondary | ICD-10-CM | POA: Diagnosis not present

## 2021-02-18 DIAGNOSIS — Z7982 Long term (current) use of aspirin: Secondary | ICD-10-CM | POA: Diagnosis not present

## 2021-02-18 DIAGNOSIS — I2510 Atherosclerotic heart disease of native coronary artery without angina pectoris: Secondary | ICD-10-CM | POA: Diagnosis not present

## 2021-02-18 DIAGNOSIS — N132 Hydronephrosis with renal and ureteral calculous obstruction: Secondary | ICD-10-CM | POA: Diagnosis not present

## 2021-02-18 DIAGNOSIS — Z87891 Personal history of nicotine dependence: Secondary | ICD-10-CM | POA: Diagnosis not present

## 2021-02-18 DIAGNOSIS — Z79899 Other long term (current) drug therapy: Secondary | ICD-10-CM | POA: Diagnosis not present

## 2021-02-18 DIAGNOSIS — Z950 Presence of cardiac pacemaker: Secondary | ICD-10-CM | POA: Insufficient documentation

## 2021-02-18 DIAGNOSIS — I110 Hypertensive heart disease with heart failure: Secondary | ICD-10-CM | POA: Insufficient documentation

## 2021-02-18 DIAGNOSIS — K219 Gastro-esophageal reflux disease without esophagitis: Secondary | ICD-10-CM | POA: Diagnosis not present

## 2021-02-18 DIAGNOSIS — I5022 Chronic systolic (congestive) heart failure: Secondary | ICD-10-CM | POA: Diagnosis not present

## 2021-02-18 DIAGNOSIS — R109 Unspecified abdominal pain: Secondary | ICD-10-CM | POA: Diagnosis present

## 2021-02-18 DIAGNOSIS — N201 Calculus of ureter: Secondary | ICD-10-CM

## 2021-02-18 LAB — CBC WITH DIFFERENTIAL/PLATELET
Abs Immature Granulocytes: 0.04 10*3/uL (ref 0.00–0.07)
Basophils Absolute: 0.1 10*3/uL (ref 0.0–0.1)
Basophils Relative: 0 %
Eosinophils Absolute: 0 10*3/uL (ref 0.0–0.5)
Eosinophils Relative: 0 %
HCT: 42.2 % (ref 39.0–52.0)
Hemoglobin: 15.1 g/dL (ref 13.0–17.0)
Immature Granulocytes: 0 %
Lymphocytes Relative: 5 %
Lymphs Abs: 0.6 10*3/uL — ABNORMAL LOW (ref 0.7–4.0)
MCH: 33.3 pg (ref 26.0–34.0)
MCHC: 35.8 g/dL (ref 30.0–36.0)
MCV: 93.2 fL (ref 80.0–100.0)
Monocytes Absolute: 0.8 10*3/uL (ref 0.1–1.0)
Monocytes Relative: 6 %
Neutro Abs: 10.7 10*3/uL — ABNORMAL HIGH (ref 1.7–7.7)
Neutrophils Relative %: 89 %
Platelets: 185 10*3/uL (ref 150–400)
RBC: 4.53 MIL/uL (ref 4.22–5.81)
RDW: 13.3 % (ref 11.5–15.5)
WBC: 12.3 10*3/uL — ABNORMAL HIGH (ref 4.0–10.5)
nRBC: 0 % (ref 0.0–0.2)

## 2021-02-18 LAB — COMPREHENSIVE METABOLIC PANEL
ALT: 20 U/L (ref 0–44)
AST: 37 U/L (ref 15–41)
Albumin: 4.9 g/dL (ref 3.5–5.0)
Alkaline Phosphatase: 85 U/L (ref 38–126)
Anion gap: 15 (ref 5–15)
BUN: 23 mg/dL (ref 8–23)
CO2: 21 mmol/L — ABNORMAL LOW (ref 22–32)
Calcium: 10.5 mg/dL — ABNORMAL HIGH (ref 8.9–10.3)
Chloride: 106 mmol/L (ref 98–111)
Creatinine, Ser: 1.63 mg/dL — ABNORMAL HIGH (ref 0.61–1.24)
GFR, Estimated: 41 mL/min — ABNORMAL LOW (ref >60)
Glucose, Bld: 137 mg/dL — ABNORMAL HIGH (ref 70–99)
Potassium: 4 mmol/L (ref 3.5–5.1)
Sodium: 142 mmol/L (ref 135–145)
Total Bilirubin: 2.1 mg/dL — ABNORMAL HIGH (ref 0.3–1.2)
Total Protein: 7.9 g/dL (ref 6.5–8.1)

## 2021-02-18 LAB — TROPONIN I (HIGH SENSITIVITY): Troponin I (High Sensitivity): 8 ng/L (ref <18)

## 2021-02-18 LAB — LIPASE, BLOOD: Lipase: 28 U/L (ref 11–51)

## 2021-02-18 MED ORDER — IOHEXOL 300 MG/ML  SOLN
100.0000 mL | Freq: Once | INTRAMUSCULAR | Status: AC | PRN
  Administered 2021-02-18: 100 mL via INTRAVENOUS

## 2021-02-18 MED ORDER — MORPHINE SULFATE (PF) 4 MG/ML IV SOLN
4.0000 mg | Freq: Once | INTRAVENOUS | Status: AC
  Administered 2021-02-18: 4 mg via INTRAVENOUS
  Filled 2021-02-18: qty 1

## 2021-02-18 MED ORDER — ONDANSETRON HCL 4 MG/2ML IJ SOLN
4.0000 mg | Freq: Once | INTRAMUSCULAR | Status: AC
  Administered 2021-02-18: 4 mg via INTRAVENOUS
  Filled 2021-02-18: qty 2

## 2021-02-18 MED ORDER — SODIUM CHLORIDE 0.9 % IV BOLUS
1000.0000 mL | Freq: Once | INTRAVENOUS | Status: AC
  Administered 2021-02-18: 1000 mL via INTRAVENOUS

## 2021-02-18 MED ORDER — HYDROMORPHONE HCL 1 MG/ML IJ SOLN
1.0000 mg | Freq: Once | INTRAMUSCULAR | Status: AC
  Administered 2021-02-18: 1 mg via INTRAVENOUS
  Filled 2021-02-18: qty 1

## 2021-02-18 NOTE — ED Provider Notes (Signed)
[facility] Emergency Department Provider Note  ____________________________________________  Time seen: Approximately 11:02 PM  I have reviewed the triage vital signs and the nursing notes.   HISTORY  Chief Complaint Abdominal Pain    HPI ODDIE BOTTGER is a 87 y.o. male with a history of aortic aneurysm, GERD, hypertension who comes ED complaining of left-sided abdominal pain since this morning, gradual onset, worsening and severe.  Constant, no aggravating or alleviating factors.  Associated nausea and vomiting.  Normal bowel movement today.  No diarrhea.  No fever.  Currently 10/10.      Past Medical History:  Diagnosis Date  . Aneurysm ([place])   . Coronary artery disease   . GERD (gastroesophageal reflux disease)   . History of kidney stones   . HLD (hyperlipidemia)   . Hypertension      Patient Active Problem List   Diagnosis Date Noted  . Gynecomastia 03/20/2020  . Left breast lump 03/08/2020  . History of nephrolithiasis 08/17/2019  . Thoracic aortic aneurysm ([place]) 08/17/2019  . Chronic systolic CHF (congestive heart failure), NYHA class 3 ([place]) 05/18/2019  . Angina at rest [facility]) 12/28/2018  . Sick sinus syndrome ([place]) 07/26/2018  . Dizziness 07/04/2018  . Anxiety and depression 04/06/2018  . Aortic atherosclerosis ([place]) 08/05/2017  . Hypertension 06/30/2017  . AAA (abdominal aortic aneurysm) ([place]) 06/30/2017  . Bradycardia 04/19/2017  . Chronic a-fib ([place]) 03/18/2016  . Sacroiliitis ([place]) 08/08/2015  . Acute idiopathic gout of right foot 03/29/2015  . Moderate tricuspid insufficiency 03/07/2015  . Disc degeneration, lumbar 04/24/2014  . Mixed hyperlipidemia 08/17/2011  . Benign essential hypertension 08/17/2011  . CAD (coronary artery disease) 08/17/2011  . Central retinal artery occlusion of right eye 08/17/2011  . History of abdominal aortic aneurysm 08/17/2011  . Insomnia 08/17/2011  . Kidney stones 08/17/2011     Past  Surgical History:  Procedure Laterality Date  . APPENDECTOMY    . CARDIAC CATHETERIZATION    . CARDIAC SURGERY    . CHOLECYSTECTOMY    . CORONARY ANGIOPLASTY    . CORONARY ARTERY BYPASS GRAFT    . MASTOIDECTOMY    . PACEMAKER INSERTION Left 07/26/2018   Procedure: INSERTION PACEMAKER-SINGLE CHAMBER INITIAL INSERT;  Surgeon: Isaias Cowman, MD;  Location: [facility] ORS;  Service: Cardiovascular;  Laterality: Left;  . RIGHT HEART CATH AND CORONARY/GRAFT ANGIOGRAPHY N/A 06/01/2019   Procedure: RIGHT HEART CATH AND CORONARY/GRAFT ANGIOGRAPHY;  Surgeon: Corey Skains, MD;  Location: [facility];  Service: Cardiovascular;  Laterality: N/A;  . TONSILLECTOMY       Prior to Admission medications   Medication Sig Start Date End Date Taking? Authorizing Provider  acetaminophen (TYLENOL) 500 MG tablet Take 500 mg by mouth every 6 (six) hours as needed (for pain.).   Yes [provider]  amLODipine (NORVASC) 5 MG tablet Take 5 mg by mouth daily.   Yes [provider]  aspirin EC 81 MG tablet Take 81 mg by mouth daily.   Yes [provider]  atorvastatin (LIPITOR) 20 MG tablet Take 20 mg by mouth daily. 06/02/19  Yes [provider]  famotidine (PEPCID) 20 MG tablet Take 20 mg by mouth 2 (two) times daily.   Yes [provider]  FLUoxetine (PROZAC) 20 MG capsule Take 20 mg by mouth daily. 01/06/21  Yes [provider]  furosemide (LASIX) 20 MG tablet Take 20 mg by mouth daily. 02/13/21  Yes [provider]  isosorbide mononitrate (IMDUR) 30 MG  24 hr tablet Take 30 mg by mouth daily. 01/06/21  Yes [provider]  losartan (COZAAR) 50 MG tablet Take 50 mg by mouth daily. 01/06/21  Yes [provider]  metoprolol succinate (TOPROL-XL) 25 MG 24 hr tablet Take 25 mg by mouth daily.   Yes [provider]     Allergies Otho Darner allergy] and Penicillins   Family History  Problem Relation Age of  Onset  . Cancer Mother   . Heart disease Mother   . Heart attack Father   . Heart disease Father     Social History Social History   Tobacco Use  . Smoking status: Former Smoker    Types: Cigarettes  . Smokeless tobacco: Never Used  Vaping Use  . Vaping Use: Never used  Substance Use Topics  . Alcohol use: Yes    Comment: wine occas.   . Drug use: No    Review of Systems  Constitutional:   No fever or chills.  ENT:   No sore throat. No rhinorrhea. Cardiovascular:   No chest pain or syncope. Respiratory:   No dyspnea or cough. Gastrointestinal:   Left-sided abdominal pain and vomiting Musculoskeletal:   Negative for focal pain or swelling All other systems reviewed and are negative except as documented above in ROS and HPI.  ____________________________________________   PHYSICAL EXAM:  VITAL SIGNS: ED Triage Vitals [02/18/21 2031]  Enc Vitals Group     BP (!) 151/63     Pulse Rate 67     Resp (!) 22     Temp 98.6 F (37 C)     Temp Source Oral     SpO2 100 %     Weight      Height      Head Circumference      Peak Flow      Pain Score      Pain Loc      Pain Edu?      Excl. in [place]?     Vital signs reviewed, nursing assessments reviewed.   Constitutional:   Alert and oriented. Non-toxic appearance. Eyes:   Conjunctivae are normal. EOMI. PERRL. ENT      Head:   Normocephalic and atraumatic.      Nose:   Wearing a mask.      Mouth/Throat:   Wearing a mask.      Neck:   No meningismus. Full ROM. Hematological/Lymphatic/Immunilogical:   No cervical lymphadenopathy. Cardiovascular:   RRR. Symmetric bilateral radial and DP pulses.  No murmurs. Cap refill less than 2 seconds. Respiratory:   Normal respiratory effort without tachypnea/retractions. Breath sounds are clear and equal bilaterally. No wheezes/rales/rhonchi. Gastrointestinal:   Soft with generalized tenderness, worse on the left.  There is pain with percussion.  Abdomen is tympanic and distended.  There is no CVA tenderness.  Positive rebound, not rigid.  Musculoskeletal:   Normal range of motion in all extremities. No joint effusions.  No lower extremity tenderness.  No edema. Neurologic:   Normal speech and language.  Motor grossly intact. No acute focal neurologic deficits are appreciated.  Skin:    Skin is warm, dry and intact. No rash noted.  No petechiae, purpura, or bullae.  ____________________________________________    LABS (pertinent positives/negatives) (all labs ordered are listed, but only abnormal results are displayed) Labs Reviewed  COMPREHENSIVE METABOLIC PANEL - Abnormal; Notable for the following components:      Result Value   CO2 21 (*)    Glucose,  Bld 137 (*)    Creatinine, Ser 1.63 (*)    Calcium 10.5 (*)    Total Bilirubin 2.1 (*)    GFR, Estimated 41 (*)    All other components within normal limits  CBC WITH DIFFERENTIAL/PLATELET - Abnormal; Notable for the following components:   WBC 12.3 (*)    Neutro Abs 10.7 (*)    Lymphs Abs 0.6 (*)    All other components within normal limits  LIPASE, BLOOD  URINALYSIS, COMPLETE (UACMP) WITH MICROSCOPIC  TROPONIN I (HIGH SENSITIVITY)  TROPONIN I (HIGH SENSITIVITY)   ____________________________________________   EKG  Interpreted by me Ventricular paced rhythm, rate of 66.  Right axis, left bundle branch block.  No acute ischemic changes.  ____________________________________________    RADIOLOGY  CT ABDOMEN PELVIS W CONTRAST  Result Date: 02/18/2021 CLINICAL DATA:  Left-sided abdominal pain. EXAM: CT ABDOMEN AND PELVIS WITH CONTRAST TECHNIQUE: Multidetector CT imaging of the abdomen and pelvis was performed using the standard protocol following bolus administration of intravenous contrast. CONTRAST:  176mL OMNIPAQUE IOHEXOL 300 MG/ML  SOLN COMPARISON:  Apr 20, 2019 FINDINGS: Lower chest: Multiple sternal wires are present. Hepatobiliary: No focal liver abnormality is seen. Status post  cholecystectomy. No biliary dilatation. Pancreas: Unremarkable. No pancreatic ductal dilatation or surrounding inflammatory changes. Spleen: Normal in size without focal abnormality. Adrenals/Urinary Tract: Adrenal glands are unremarkable. Kidneys are normal in size. 2.1 cm and 1.8 cm cysts are seen within the upper pole of the left kidney a 2 mm obstructing renal stone is seen at the left UVJ (axial CT image 85, CT series number 2). Mild left-sided hydronephrosis and hydroureter are present. Bilateral, asymmetric perinephric inflammatory fat stranding is seen, left greater than right, with mild delay in renal cortical enhancement on the left. Bladder is unremarkable. Stomach/Bowel: There is a very small hiatal hernia. The appendix is surgically absent. No evidence of bowel wall thickening, distention, or inflammatory changes. Vascular/Lymphatic: There is persistent tortuosity and aneurysmal dilatation of the visualized portion of the descending thoracic aorta. Prior stenting of the infrarenal abdominal aorta and bilateral common iliac arteries is seen, without evidence of endograft occlusion or endograft leak. 3.3 cm x 2.2 cm focal outpouching of the infrarenal abdominal aorta is seen. An extensive amount of associated atherosclerosis is present. No abnormal abdominal or pelvic lymph nodes are seen. Reproductive: The prostate gland is moderately enlarged. Other: No abdominal wall hernia or abnormality. No abdominopelvic ascites. Musculoskeletal: No acute or significant osseous findings. IMPRESSION: 1. 2 mm obstructing renal stone at the left UVJ. 2. Stable descending thoracic aortic aneurysm with prior infrarenal abdominal aortic aneurysm repair. 3. Small hiatal hernia. 4. Enlarged prostate gland. 5. Aortic atherosclerosis. Aortic Atherosclerosis (ICD10-I70.0). Electronically Signed   By: Virgina Norfolk M.D.   On: 02/18/2021 21:28     ____________________________________________   PROCEDURES Procedures  ____________________________________________  DIFFERENTIAL DIAGNOSIS   Pneumoperitoneum, bowel obstruction, volvulus, ruptured AAA, diverticulitis, kidney stone  CLINICAL IMPRESSION / ASSESSMENT AND PLAN / ED COURSE  Medications ordered in the ED: Medications  sodium chloride 0.9 % bolus 1,000 mL (1,000 mLs Intravenous Bolus from Bag 02/18/21 2125)  ondansetron (ZOFRAN) injection 4 mg (4 mg Intravenous Given 02/18/21 2100)  morphine 4 MG/ML injection 4 mg (4 mg Intravenous Given 02/18/21 2100)  iohexol (OMNIPAQUE) 300 MG/ML solution 100 mL (100 mLs Intravenous Contrast Given 02/18/21 2107)  HYDROmorphone (DILAUDID) injection 1 mg (1 mg Intravenous Given 02/18/21 2122)    Pertinent labs & imaging results that were available during my care  of the patient were reviewed by me and considered in my medical decision making (see chart for details).  MANOLITO JUREWICZ was evaluated in Emergency Department on 02/18/2021 for the symptoms described in the history of present illness. He was evaluated in the context of the global COVID-19 pandemic, which necessitated consideration that the patient might be at risk for infection with the SARS-CoV-2 virus that causes COVID-19. Institutional protocols and algorithms that pertain to the evaluation of patients at risk for COVID-19 are in a state of rapid change based on information released by regulatory bodies including the CDC and federal and state organizations. These policies and algorithms were followed during the patient's care in the ED.     Clinical Course as of 02/18/21 2302  Tue Feb 18, 2021  2120 CT imaging viewed by me, possibly shows a recently passed 2 to 3 mm stone in the left ureter which is now in the bladder.  No free air, no bowel obstruction or volvulus. [PS]    Clinical Course User Index [PS] Carrie Mew, MD      ----------------------------------------- 11:25 PM on 02/18/2021 -----------------------------------------  Radiology report confirms 2 mm kidney stone distal at the UVJ.  No urinary symptoms, normal vital signs.  Not septic.  Highly doubt concurrent urinary tract infection.  Patient eager to be discharged, will have him follow-up with primary care.  ____________________________________________   FINAL CLINICAL IMPRESSION(S) / ED DIAGNOSES    Final diagnoses:  Ureterolithiasis     ED Discharge Orders    None      Portions of this note were generated with dragon dictation software. Dictation errors may occur despite best attempts at proofreading.   Carrie Mew, MD 02/18/21 2325

## 2021-02-18 NOTE — ED Triage Notes (Signed)
Pt c/o lower left sided abdominal pain since this AM, urinating frequently, N/V, and unable to sit still. Per spouse, pt has been pacing and crying out in pain all day. Pt has significant cardiac hx as well. Pt SOB intermittently with pain.

## 2021-02-19 ENCOUNTER — Other Ambulatory Visit: Payer: Self-pay

## 2021-02-19 ENCOUNTER — Ambulatory Visit
Admission: EM | Admit: 2021-02-19 | Discharge: 2021-02-19 | Disposition: A | Discharge status: Home / Self Care | Payer: Medicare Other | Attending: Emergency Medicine | Admitting: Emergency Medicine

## 2021-07-17 ENCOUNTER — Other Ambulatory Visit (INDEPENDENT_AMBULATORY_CARE_PROVIDER_SITE_OTHER): Payer: Self-pay | Admitting: Nurse Practitioner

## 2021-07-17 DIAGNOSIS — I714 Abdominal aortic aneurysm, without rupture, unspecified: Secondary | ICD-10-CM

## 2021-07-18 ENCOUNTER — Other Ambulatory Visit: Payer: Self-pay

## 2021-07-18 ENCOUNTER — Ambulatory Visit (INDEPENDENT_AMBULATORY_CARE_PROVIDER_SITE_OTHER): Payer: Medicare Other | Admitting: Vascular Surgery

## 2021-07-18 ENCOUNTER — Encounter (INDEPENDENT_AMBULATORY_CARE_PROVIDER_SITE_OTHER): Payer: Self-pay | Admitting: Vascular Surgery

## 2021-07-18 ENCOUNTER — Ambulatory Visit (INDEPENDENT_AMBULATORY_CARE_PROVIDER_SITE_OTHER): Payer: Medicare Other

## 2021-07-18 VITALS — BP 124/70 | HR 81 | Resp 16 | Wt 182.0 lb

## 2021-07-18 DIAGNOSIS — I714 Abdominal aortic aneurysm, without rupture, unspecified: Secondary | ICD-10-CM

## 2021-07-18 DIAGNOSIS — I10 Essential (primary) hypertension: Secondary | ICD-10-CM

## 2021-07-18 NOTE — Progress Notes (Signed)
MRN : [id number]  [name] is a 87 y.o. ([birth year]) male who presents with chief complaint of  Chief Complaint  Patient presents with   Follow-up    Ultrasound follow up  .  History of Present Illness: Patient returns today in follow up of his abdominal aortic aneurysm.  He is now about 9 years status post endovascular repair of his aneurysm.  He is doing well.  He denies any new complaints or aneurysm related symptoms. Specifically, the patient denies new back or abdominal pain, or signs of peripheral embolization.  Duplex today shows a patent stent graft without endoleak and a stable sac size of just over 4 cm in maximal diameter.  Current Outpatient Medications  Medication Sig Dispense Refill   acetaminophen (TYLENOL) 500 MG tablet Take 500 mg by mouth every 6 (six) hours as needed (for pain.).     amLODipine (NORVASC) 5 MG tablet Take 5 mg by mouth daily.     aspirin EC 81 MG tablet Take 81 mg by mouth daily.     atorvastatin (LIPITOR) 20 MG tablet Take 20 mg by mouth daily.     famotidine (PEPCID) 20 MG tablet Take 20 mg by mouth 2 (two) times daily.     FLUoxetine (PROZAC) 20 MG capsule Take 20 mg by mouth daily.     furosemide (LASIX) 20 MG tablet Take 20 mg by mouth daily.     isosorbide mononitrate (IMDUR) 30 MG 24 hr tablet Take 30 mg by mouth daily.     losartan (COZAAR) 50 MG tablet Take 50 mg by mouth daily.     metoprolol succinate (TOPROL-XL) 25 MG 24 hr tablet Take 25 mg by mouth daily.     No current facility-administered medications for this visit.    Past Medical History:  Diagnosis Date   Aneurysm ([place])    Coronary artery disease    GERD (gastroesophageal reflux disease)    History of kidney stones    HLD (hyperlipidemia)    Hypertension     Past Surgical History:  Procedure Laterality Date   APPENDECTOMY     CARDIAC CATHETERIZATION     CARDIAC SURGERY     CHOLECYSTECTOMY     CORONARY ANGIOPLASTY     CORONARY ARTERY BYPASS GRAFT      MASTOIDECTOMY     PACEMAKER INSERTION Left 07/26/2018   Procedure: INSERTION PACEMAKER-SINGLE CHAMBER INITIAL INSERT;  Surgeon: Isaias Cowman, MD;  Location: [facility] ORS;  Service: Cardiovascular;  Laterality: Left;   RIGHT HEART CATH AND CORONARY/GRAFT ANGIOGRAPHY N/A 06/01/2019   Procedure: RIGHT HEART CATH AND CORONARY/GRAFT ANGIOGRAPHY;  Surgeon: Corey Skains, MD;  Location: [facility];  Service: Cardiovascular;  Laterality: N/A;   TONSILLECTOMY       Social History   Tobacco Use   Smoking status: Former    Types: Cigarettes   Smokeless tobacco: Never  Vaping Use   Vaping Use: Never used  Substance Use Topics   Alcohol use: Yes    Comment: wine occas.    Drug use: No       Family History  Problem Relation Age of Onset   Cancer Mother    Heart disease Mother    Heart attack Father    Heart disease Father      Allergies  Allergen Reactions   Crab [Shellfish Allergy] Swelling    Lips/fingers--whole body swelling (caused hospitalized) CRABS ONLY   Penicillins Itching, Swelling and Other (See Comments)    Has  patient had a PCN reaction causing immediate rash, facial/tongue/throat swelling, SOB or lightheadedness with hypotension: Yes Has patient had a PCN reaction causing severe rash involving mucus membranes or skin necrosis: No Has patient had a PCN reaction that required hospitalization No Has patient had a PCN reaction occurring within the last 10 years: No If all of the above answers are "NO", then may proceed with Cephalosporin use.     REVIEW OF SYSTEMS (Negative unless checked)  Constitutional: '[]'$ Weight loss  '[]'$ Fever  '[]'$ Chills Cardiac: '[]'$ Chest pain   '[]'$ Chest pressure   '[]'$ Palpitations   '[]'$ Shortness of breath when laying flat   '[]'$ Shortness of breath at rest   '[]'$ Shortness of breath with exertion. Vascular:  '[]'$ Pain in legs with walking   '[]'$ Pain in legs at rest   '[]'$ Pain in legs when laying flat   '[]'$ Claudication   '[]'$ Pain in feet when walking   '[]'$ Pain in feet at rest  '[]'$ Pain in feet when laying flat   '[]'$ History of DVT   '[]'$ Phlebitis   '[]'$ Swelling in legs   '[]'$ Varicose veins   '[]'$ Non-healing ulcers Pulmonary:   '[]'$ Uses home oxygen   '[]'$ Productive cough   '[]'$ Hemoptysis   '[]'$ Wheeze  '[]'$ COPD   '[]'$ Asthma Neurologic:  '[]'$ Dizziness  '[]'$ Blackouts   '[]'$ Seizures   '[]'$ History of stroke   '[]'$ History of TIA  '[]'$ Aphasia   '[]'$ Temporary blindness   '[]'$ Dysphagia   '[]'$ Weakness or numbness in arms   '[]'$ Weakness or numbness in legs Musculoskeletal:  '[x]'$ Arthritis   '[]'$ Joint swelling   '[x]'$ Joint pain   '[]'$ Low back pain Hematologic:  '[]'$ Easy bruising  '[]'$ Easy bleeding   '[]'$ Hypercoagulable state   '[]'$ Anemic   Gastrointestinal:  '[]'$ Blood in stool   '[]'$ Vomiting blood  '[]'$ Gastroesophageal reflux/heartburn   '[]'$ Abdominal pain Genitourinary:  '[]'$ Chronic kidney disease   '[]'$ Difficult urination  '[]'$ Frequent urination  '[]'$ Burning with urination   '[]'$ Hematuria Skin:  '[]'$ Rashes   '[]'$ Ulcers   '[]'$ Wounds Psychological:  '[]'$ History of anxiety   '[]'$  History of major depression.  Physical Examination  BP 124/70 (BP Location: Right Arm)   Pulse 81   Resp 16   Wt 182 lb (82.6 kg)   BMI 23.37 kg/m  Gen:  WD/WN, NAD Head: [place]/AT, No temporalis wasting. Ear/Nose/Throat: Hearing grossly intact, nares w/o erythema or drainage Eyes: Conjunctiva clear. Sclera non-icteric Neck: Supple.  Trachea midline Pulmonary:  Good air movement, no use of accessory muscles.  Cardiac: RRR, no JVD Vascular:  Vessel Right Left  Radial Palpable Palpable                          PT Palpable Palpable  DP Palpable Palpable   Gastrointestinal: soft, non-tender/non-distended. No guarding/reflex.  Musculoskeletal: M/S 5/5 throughout.  No deformity or atrophy. Walks with a cane. No edema. Neurologic: Sensation grossly intact in extremities.  Symmetrical.  Speech is fluent.  Psychiatric: Judgment intact, Mood & affect appropriate for pt's clinical situation. Dermatologic: No rashes or ulcers noted.  No cellulitis or open  wounds.      Labs No results found for this or any previous visit (from the past 2160 hour(s)).  Radiology No results found.  Assessment/Plan Hypertension blood pressure control important in reducing the progression of atherosclerotic disease and aneurysmal disease. On appropriate oral medications.     AAA (abdominal aortic aneurysm) without rupture (HCC) Duplex today shows a patent stent graft without endoleak and a maximal aortic sac diameter of just over 4 cm.  He is doing well.  This is unchanged from his previous  study in 2020.  We will plan on checking this roughly every year.  He missed his appointment last year due to severe illness in the family.   Leotis Pain, MD  07/18/2021 9:22 AM    This note was created with Dragon medical transcription system.  Any errors from dictation are purely unintentional

## 2021-09-25 ENCOUNTER — Other Ambulatory Visit (HOSPITAL_COMMUNITY): Payer: Self-pay | Admitting: Otolaryngology

## 2021-09-25 ENCOUNTER — Other Ambulatory Visit: Payer: Self-pay | Admitting: Otolaryngology

## 2021-09-25 DIAGNOSIS — R42 Dizziness and giddiness: Secondary | ICD-10-CM

## 2021-09-25 DIAGNOSIS — H9192 Unspecified hearing loss, left ear: Secondary | ICD-10-CM

## 2021-09-30 ENCOUNTER — Other Ambulatory Visit: Payer: Self-pay | Admitting: Otolaryngology

## 2021-09-30 DIAGNOSIS — R42 Dizziness and giddiness: Secondary | ICD-10-CM

## 2021-09-30 DIAGNOSIS — H9192 Unspecified hearing loss, left ear: Secondary | ICD-10-CM

## 2021-10-20 ENCOUNTER — Other Ambulatory Visit: Payer: Self-pay | Admitting: Otolaryngology

## 2021-10-20 ENCOUNTER — Ambulatory Visit
Admission: RE | Admit: 2021-10-20 | Discharge: 2021-10-20 | Disposition: A | Discharge status: Home / Self Care | Payer: Medicare Other | Source: Ambulatory Visit | Attending: Otolaryngology | Admitting: Otolaryngology

## 2021-10-20 DIAGNOSIS — R42 Dizziness and giddiness: Secondary | ICD-10-CM

## 2021-10-20 DIAGNOSIS — H9192 Unspecified hearing loss, left ear: Secondary | ICD-10-CM | POA: Diagnosis present

## 2021-10-20 LAB — POCT I-STAT CREATININE: Creatinine, Ser: 1.2 mg/dL (ref 0.61–1.24)

## 2021-10-20 MED ORDER — IOHEXOL 350 MG/ML SOLN
75.0000 mL | Freq: Once | INTRAVENOUS | Status: AC | PRN
  Administered 2021-10-20: 75 mL via INTRAVENOUS

## 2022-02-19 ENCOUNTER — Encounter: Payer: Self-pay | Admitting: Ophthalmology

## 2022-02-24 NOTE — Discharge Instructions (Signed)

## 2022-02-26 ENCOUNTER — Ambulatory Visit: Payer: Medicare Other | Admitting: Anesthesiology

## 2022-02-26 ENCOUNTER — Other Ambulatory Visit: Payer: Self-pay

## 2022-02-26 ENCOUNTER — Encounter: Payer: Self-pay | Admitting: Ophthalmology

## 2022-02-26 ENCOUNTER — Encounter
Admission: RE | Disposition: A | Discharge status: Home / Self Care | Payer: Self-pay | Source: Home / Self Care | Attending: Ophthalmology

## 2022-02-26 ENCOUNTER — Ambulatory Visit
Admission: RE | Admit: 2022-02-26 | Discharge: 2022-02-26 | Disposition: A | Discharge status: Home / Self Care | Payer: Medicare Other | Attending: Ophthalmology | Admitting: Ophthalmology

## 2022-02-26 DIAGNOSIS — I25119 Atherosclerotic heart disease of native coronary artery with unspecified angina pectoris: Secondary | ICD-10-CM | POA: Diagnosis not present

## 2022-02-26 DIAGNOSIS — F419 Anxiety disorder, unspecified: Secondary | ICD-10-CM | POA: Insufficient documentation

## 2022-02-26 DIAGNOSIS — Z87891 Personal history of nicotine dependence: Secondary | ICD-10-CM | POA: Diagnosis not present

## 2022-02-26 DIAGNOSIS — I110 Hypertensive heart disease with heart failure: Secondary | ICD-10-CM | POA: Insufficient documentation

## 2022-02-26 DIAGNOSIS — Z950 Presence of cardiac pacemaker: Secondary | ICD-10-CM | POA: Insufficient documentation

## 2022-02-26 DIAGNOSIS — I495 Sick sinus syndrome: Secondary | ICD-10-CM | POA: Diagnosis not present

## 2022-02-26 DIAGNOSIS — E785 Hyperlipidemia, unspecified: Secondary | ICD-10-CM | POA: Insufficient documentation

## 2022-02-26 DIAGNOSIS — I509 Heart failure, unspecified: Secondary | ICD-10-CM | POA: Diagnosis not present

## 2022-02-26 DIAGNOSIS — K219 Gastro-esophageal reflux disease without esophagitis: Secondary | ICD-10-CM | POA: Diagnosis not present

## 2022-02-26 DIAGNOSIS — H2511 Age-related nuclear cataract, right eye: Secondary | ICD-10-CM | POA: Diagnosis present

## 2022-02-26 DIAGNOSIS — F32A Depression, unspecified: Secondary | ICD-10-CM | POA: Diagnosis not present

## 2022-02-26 HISTORY — PX: CATARACT EXTRACTION W/PHACO: SHX586

## 2022-02-26 HISTORY — DX: Unspecified osteoarthritis, unspecified site: M19.90

## 2022-02-26 SURGERY — PHACOEMULSIFICATION, CATARACT, WITH IOL INSERTION
Anesthesia: Monitor Anesthesia Care | Site: Eye | Laterality: Left

## 2022-02-26 MED ORDER — TETRACAINE HCL 0.5 % OP SOLN
1.0000 [drp] | OPHTHALMIC | Status: DC | PRN
  Administered 2022-02-26 (×3): 1 [drp] via OPHTHALMIC

## 2022-02-26 MED ORDER — ACETAMINOPHEN 325 MG PO TABS: 325.0000 mg | ORAL_TABLET | ORAL | Status: DC | PRN

## 2022-02-26 MED ORDER — MOXIFLOXACIN HCL 0.5 % OP SOLN
OPHTHALMIC | Status: DC | PRN
  Administered 2022-02-26: 0.2 mL via OPHTHALMIC

## 2022-02-26 MED ORDER — SIGHTPATH DOSE#1 BSS IO SOLN
INTRAOCULAR | Status: DC | PRN
  Administered 2022-02-26: 133 mL via OPHTHALMIC

## 2022-02-26 MED ORDER — FENTANYL CITRATE (PF) 100 MCG/2ML IJ SOLN
INTRAMUSCULAR | Status: DC | PRN
  Administered 2022-02-26: 50 ug via INTRAVENOUS

## 2022-02-26 MED ORDER — SIGHTPATH DOSE#1 BSS IO SOLN
INTRAOCULAR | Status: DC | PRN
  Administered 2022-02-26: 15 mL

## 2022-02-26 MED ORDER — ONDANSETRON HCL 4 MG/2ML IJ SOLN: 4.0000 mg | Freq: Once | INTRAMUSCULAR | Status: DC | PRN

## 2022-02-26 MED ORDER — ACETAMINOPHEN 160 MG/5ML PO SOLN: 325.0000 mg | ORAL | Status: DC | PRN

## 2022-02-26 MED ORDER — MIDAZOLAM HCL 2 MG/2ML IJ SOLN
INTRAMUSCULAR | Status: DC | PRN
Start: 2022-02-26 — End: 2022-02-26
  Administered 2022-02-26: .5 mg via INTRAVENOUS

## 2022-02-26 MED ORDER — LIDOCAINE HCL (PF) 2 % IJ SOLN
INTRAOCULAR | Status: DC | PRN
  Administered 2022-02-26: 1 mL via INTRAOCULAR

## 2022-02-26 MED ORDER — BRIMONIDINE TARTRATE-TIMOLOL 0.2-0.5 % OP SOLN
OPHTHALMIC | Status: DC | PRN
  Administered 2022-02-26: 1 [drp] via OPHTHALMIC

## 2022-02-26 MED ORDER — SIGHTPATH DOSE#1 NA HYALUR & NA CHOND-NA HYALUR IO KIT
PACK | INTRAOCULAR | Status: DC | PRN
  Administered 2022-02-26: 1 via OPHTHALMIC

## 2022-02-26 MED ORDER — ARMC OPHTHALMIC DILATING DROPS
1.0000 "application " | OPHTHALMIC | Status: DC | PRN
  Administered 2022-02-26 (×3): 1 via OPHTHALMIC

## 2022-02-26 SURGICAL SUPPLY — 15 items
CATARACT SUITE SIGHTPATH (MISCELLANEOUS) ×2 IMPLANT
DISSECTOR HYDRO NUCLEUS 50X22 (MISCELLANEOUS) ×2 IMPLANT
DRSG TEGADERM 2-3/8X2-3/4 SM (GAUZE/BANDAGES/DRESSINGS) ×2 IMPLANT
FEE CATARACT SUITE SIGHTPATH (MISCELLANEOUS) ×1 IMPLANT
GLOVE SURG GAMMEX PI TX LF 7.5 (GLOVE) ×2 IMPLANT
GLOVE SURG SYN 8.5  E (GLOVE) ×1
GLOVE SURG SYN 8.5 E (GLOVE) ×1 IMPLANT
GLOVE SURG SYN 8.5 PF PI (GLOVE) ×1 IMPLANT
LENS IOL TECNIS EYHANCE 21.0 (Intraocular Lens) ×1 IMPLANT
NDL FILTER BLUNT 18X1 1/2 (NEEDLE) ×1 IMPLANT
NEEDLE FILTER BLUNT 18X 1/2SAF (NEEDLE) ×1
NEEDLE FILTER BLUNT 18X1 1/2 (NEEDLE) ×1 IMPLANT
SYR 3ML LL SCALE MARK (SYRINGE) ×2 IMPLANT
SYR 5ML LL (SYRINGE) ×2 IMPLANT
WATER STERILE IRR 250ML POUR (IV SOLUTION) ×2 IMPLANT

## 2022-02-26 NOTE — Transfer of Care (Signed)
Immediate Anesthesia Transfer of Care Note ? ?Patient: [name] ? ?Procedure(s) Performed: CATARACT EXTRACTION PHACO AND INTRAOCULAR LENS PLACEMENT (IOC) LEFT (Left: Eye) ? ?Patient Location: PACU ? ?Anesthesia Type: MAC ? ?Level of Consciousness: awake, alert  and patient cooperative ? ?Airway and Oxygen Therapy: Patient Spontanous Breathing and Patient connected to supplemental oxygen ? ?Post-op Assessment: Post-op Vital signs reviewed, Patient's Cardiovascular Status Stable, Respiratory Function Stable, Patent Airway and No signs of Nausea or vomiting ? ?Post-op Vital Signs: Reviewed and stable ? ?Complications: No notable events documented. ? ?

## 2022-02-26 NOTE — Anesthesia Postprocedure Evaluation (Signed)
Anesthesia Post Note ? ?Patient: [name] ? ?Procedure(s) Performed: CATARACT EXTRACTION PHACO AND INTRAOCULAR LENS PLACEMENT (IOC) LEFT (Left: Eye) ? ? ?  ?Patient location during evaluation: PACU ?Anesthesia Type: MAC ?Level of consciousness: awake ?Pain management: pain level controlled ?Vital Signs Assessment: post-procedure vital signs reviewed and stable ?Respiratory status: respiratory function stable ?Cardiovascular status: stable ?Postop Assessment: no apparent nausea or vomiting ?Anesthetic complications: no ? ? ?No notable events documented. ? ?Veda Canning ? ? ? ? ? ?

## 2022-02-26 NOTE — Anesthesia Procedure Notes (Signed)
Procedure Name: [place] ?Date/Time: 02/26/2022 2:17 PM ?Performed by: Cameron Ali, CRNA ?Pre-anesthesia Checklist: Patient identified, Emergency Drugs available, Suction available, Timeout performed and Patient being monitored ?Patient Re-evaluated:Patient Re-evaluated prior to induction ?Oxygen Delivery Method: Nasal cannula ?Placement Confirmation: positive ETCO2 ? ? ? ? ?

## 2022-02-26 NOTE — Anesthesia Preprocedure Evaluation (Signed)
Anesthesia Evaluation  ?Patient identified by MRN, date of birth, ID band ?Patient awake ? ? ? ?Reviewed: ?Allergy & Precautions, NPO status  ? ?Airway ?Mallampati: II ? ?TM Distance: >3 FB ? ? ? ? Dental ?  ?Pulmonary ?former smoker,  ?  ?Pulmonary exam normal ? ? ? ? ? ? ? Cardiovascular ?hypertension, + angina + CAD and +CHF  ?+ dysrhythmias (sick sinus syndrome) + pacemaker  ?Rhythm:Regular Rate:Normal ? ?HLD ?  ?Neuro/Psych ?PSYCHIATRIC DISORDERS Anxiety Depression   ? GI/Hepatic ?GERD  ,  ?Endo/Other  ? ? Renal/GU ?  ? ?  ?Musculoskeletal ? ?(+) Arthritis ,  ? Abdominal ?  ?Peds ? Hematology ?  ?Anesthesia Other Findings ? ? Reproductive/Obstetrics ? ?  ? ? ? ? ? ? ? ? ? ? ? ? ? ?  ?  ? ? ? ? ? ? ? ? ?Anesthesia Physical ? ?Anesthesia Plan ? ?ASA: 4 ? ?Anesthesia Plan: MAC  ? ?Post-op Pain Management: Minimal or no pain anticipated  ? ?Induction: Intravenous ? ?PONV Risk Score and Plan: 1 and TIVA, Midazolam and Treatment may vary due to age or medical condition ? ?Airway Management Planned: Natural Airway and Nasal Cannula ? ?Additional Equipment:  ? ?Intra-op Plan:  ? ?Post-operative Plan:  ? ?Informed Consent: I have reviewed the patients History and Physical, chart, labs and discussed the procedure including the risks, benefits and alternatives for the proposed anesthesia with the patient or authorized representative who has indicated his/her understanding and acceptance.  ? ? ? ? ? ?Plan Discussed with: CRNA ? ?Anesthesia Plan Comments:   ? ? ? ? ? ? ?Anesthesia Quick Evaluation ? ?

## 2022-02-26 NOTE — Op Note (Signed)
OPERATIVE NOTE ? ?ELERY CADENHEAD ?[id number] ?02/26/2022 ? ? ?PREOPERATIVE DIAGNOSIS: Nuclear sclerotic cataract right eye. H25.11 ?  ?POSTOPERATIVE DIAGNOSIS: Nuclear sclerotic cataract right eye. H25.11 ?  ?PROCEDURE:  Phacoemusification with posterior chamber intraocular lens placement of the right eye  ?Ultrasound time: Procedure(s): ?CATARACT EXTRACTION PHACO AND INTRAOCULAR LENS PLACEMENT (IOC) LEFT (Left) ? ?LENS:   ?Implant Name Type Inv. Item Serial No. Manufacturer Lot No. LRB No. Used Action  ?LENS IOL TECNIS EYHANCE 21.0 - [id number] Intraocular Lens LENS IOL TECNIS EYHANCE 21.0 [id number] [employer]  Left 1 Implanted  ?   ? ?SURGEON:  Courtney Heys. Lazarus Salines, MD ?  ?ANESTHESIA:  Topical with tetracaine drops, augmented with 1% preservative-free intracameral lidocaine. ?  ?COMPLICATIONS:  None. ?  ?DESCRIPTION OF PROCEDURE:  The patient was identified in the holding room and transported to the operating room and placed in the supine position under the operating microscope.  The right eye was identified as the operative eye, which was prepped and draped in the usual sterile ophthalmic fashion. ?  ?A 1 millimeter clear-corneal paracentesis was made superotemporally. Preservative-free 1% lidocaine mixed with 1:1,000 bisulfite-free aqueous solution of epinephrine was injected into the anterior chamber. The anterior chamber was then filled with Viscoat viscoelastic. A 2.4 millimeter keratome was used to make a clear-corneal incision inferotemporally. A curvilinear capsulorrhexis was made with a cystotome and capsulorrhexis forceps. Balanced salt solution was used to hydrodissect and hydrodelineate the nucleus. Phacoemulsification was then used to remove the lens nucleus and epinucleus. The remaining cortex was then removed using the irrigation and aspiration handpiece. Provisc was then placed into the capsular bag to distend it for lens placement. An intraocular lens was then injected into the capsular bag. The  remaining viscoelastic was aspirated. ?  ?Wounds were hydrated with balanced salt solution.  The anterior chamber was inflated to a physiologic pressure with balanced salt solution.  No wound leaks were noted. Vigamox was injected into the anterior chamber. Timolol and Brimonidine drops were applied to the eye.  The patient was taken to the recovery room in stable condition without complications of anesthesia or surgery. ? ?Norvel Richards ?02/26/2022, 3:02 PM  ?

## 2022-02-26 NOTE — H&P (Signed)
[place]  ? ?Primary Care Physician:  Valera Castle, MD ?Ophthalmologist: Dr. Merleen Nicely ? ?Pre-Procedure History & Physical: ?HPI:  [name] is a 88 y.o. male here for cataract surgery. ?  ?Past Medical History:  ?Diagnosis Date  ? Aneurysm ([place])   ? Arthritis   ? Chronic a-fib ([place]) 03/18/2016  ? Overview:  Sp LAA closure device 2017  ? Coronary artery disease   ? GERD (gastroesophageal reflux disease)   ? History of kidney stones   ? HLD (hyperlipidemia)   ? Hypertension   ? Sick sinus syndrome ([place]) 07/26/2018  ? Sp single chamber pacer 2019  ? ? ?Past Surgical History:  ?Procedure Laterality Date  ? APPENDECTOMY    ? CARDIAC CATHETERIZATION    ? CARDIAC SURGERY    ? CHOLECYSTECTOMY    ? CORONARY ANGIOPLASTY    ? CORONARY ARTERY BYPASS GRAFT  1995  ? 4 vessel.  [place], [place]  ? MASTOIDECTOMY    ? PACEMAKER INSERTION Left 07/26/2018  ? Procedure: INSERTION PACEMAKER-SINGLE CHAMBER INITIAL INSERT;  Surgeon: Isaias Cowman, MD;  Location: [facility] ORS;  Service: Cardiovascular;  Laterality: Left;  ? RIGHT HEART CATH AND CORONARY/GRAFT ANGIOGRAPHY N/A 06/01/2019  ? Procedure: RIGHT HEART CATH AND CORONARY/GRAFT ANGIOGRAPHY;  Surgeon: Corey Skains, MD;  Location: [place] [facility];  Service: Cardiovascular;  Laterality: N/A;  ? TONSILLECTOMY    ? ? ?Prior to Admission medications   ?Medication Sig Start Date End Date Taking? Authorizing Provider  ?acetaminophen (TYLENOL) 500 MG tablet Take 500 mg by mouth every 6 (six) hours as needed (for pain.).   Yes [provider]  ?amLODipine (NORVASC) 5 MG tablet Take 5 mg by mouth daily.   Yes [provider]  ?aspirin EC 81 MG tablet Take 81 mg by mouth daily.   Yes [provider]  ?atorvastatin (LIPITOR) 20 MG tablet Take 20 mg by mouth daily. 06/02/19  Yes [provider]  ?famotidine (PEPCID) 20 MG tablet Take 20 mg by mouth 2 (two) times daily.   Yes [provider]  ?FLUoxetine (PROZAC)  20 MG capsule Take 20 mg by mouth daily. 01/06/21  Yes [provider]  ?furosemide (LASIX) 20 MG tablet Take 20 mg by mouth daily. 02/13/21  Yes [provider]  ?isosorbide mononitrate (IMDUR) 30 MG 24 hr tablet Take 30 mg by mouth daily. 01/06/21  Yes [provider]  ?losartan (COZAAR) 50 MG tablet Take 50 mg by mouth daily. 01/06/21  Yes [provider]  ?metoprolol succinate (TOPROL-XL) 25 MG 24 hr tablet Take 25 mg by mouth daily.   Yes [provider]  ?Multiple Vitamins-Minerals (CENTRUM SILVER PO) Take by mouth daily.   Yes [provider]  ? ? ?Allergies as of 02/11/2022 - Review Complete 10/20/2021  ?Allergen Reaction Noted  ? Crab [shellfish allergy] Swelling 07/08/2018  ? Penicillins Itching, Swelling, and Other (See Comments) 12/10/2015  ? ? ?Family History  ?Problem Relation Age of Onset  ? Cancer Mother   ? Heart disease Mother   ? Heart attack Father   ? Heart disease Father   ? ? ?Social History  ? ?Socioeconomic History  ? Marital status: Married  ?  Spouse name: Not on file  ? Number of children: Not on file  ? Years of education: Not on file  ? Highest education level: Not on file  ?Occupational History  ? Not on file  ?Tobacco Use  ? Smoking status: Former  ?  Types: Cigarettes  ?  Quit date: 21  ?  Years since quitting: 51.2  ? Smokeless tobacco: Never  ?Vaping Use  ? Vaping Use: Never used  ?Substance and Sexual Activity  ? Alcohol use: Yes  ?  Comment: wine occas.   ? Drug use: No  ? Sexual activity: Not on file  ?Other Topics Concern  ? Not on file  ?Social History Narrative  ? Not on file  ? ?Social Determinants of Health  ? ?Financial Resource Strain: Not on file  ?Food Insecurity: Not on file  ?Transportation Needs: Not on file  ?Physical Activity: Not on file  ?Stress: Not on file  ?Social Connections: Not on file  ?Intimate Partner Violence: Not on file  ? ? ?Review of Systems: ?See HPI, otherwise negative ROS ? ?Physical Exam: ?BP  135/81   Pulse 88   Temp (!) 97.2 ?F (36.2 ?C) (Temporal)   Resp 20   Ht '6\' 1"'$  (1.854 m)   Wt 84.4 kg   SpO2 96%   BMI 24.54 kg/m?  ?General:   Alert, cooperative in NAD ?Head:  Normocephalic and atraumatic. ?Respiratory:  Normal work of breathing. ?Cardiovascular:  RRR ? ?Impression/Plan: ?[name] is here for cataract surgery. ? ?Risks, benefits, limitations, and alternatives regarding cataract surgery have been reviewed with the patient.  Questions have been answered.  All parties agreeable. ? ? ?Norvel Richards, MD  02/26/2022, 2:05 PM  ? ?

## 2022-02-27 ENCOUNTER — Encounter: Payer: Self-pay | Admitting: Ophthalmology

## 2022-02-27 ENCOUNTER — Other Ambulatory Visit: Payer: Self-pay

## 2022-03-17 NOTE — Discharge Instructions (Signed)

## 2022-03-19 ENCOUNTER — Ambulatory Visit
Admission: RE | Admit: 2022-03-19 | Discharge: 2022-03-19 | Disposition: A | Discharge status: Home / Self Care | Payer: Medicare Other | Attending: Ophthalmology | Admitting: Ophthalmology

## 2022-03-19 ENCOUNTER — Encounter
Admission: RE | Disposition: A | Discharge status: Home / Self Care | Payer: Self-pay | Source: Home / Self Care | Attending: Ophthalmology

## 2022-03-19 ENCOUNTER — Ambulatory Visit: Payer: Medicare Other | Admitting: Anesthesiology

## 2022-03-19 ENCOUNTER — Encounter: Payer: Self-pay | Admitting: Ophthalmology

## 2022-03-19 ENCOUNTER — Other Ambulatory Visit: Payer: Self-pay

## 2022-03-19 DIAGNOSIS — I10 Essential (primary) hypertension: Secondary | ICD-10-CM | POA: Insufficient documentation

## 2022-03-19 DIAGNOSIS — Z950 Presence of cardiac pacemaker: Secondary | ICD-10-CM | POA: Diagnosis not present

## 2022-03-19 DIAGNOSIS — H2511 Age-related nuclear cataract, right eye: Secondary | ICD-10-CM | POA: Insufficient documentation

## 2022-03-19 DIAGNOSIS — Z87891 Personal history of nicotine dependence: Secondary | ICD-10-CM | POA: Insufficient documentation

## 2022-03-19 DIAGNOSIS — E785 Hyperlipidemia, unspecified: Secondary | ICD-10-CM | POA: Diagnosis not present

## 2022-03-19 DIAGNOSIS — F32A Depression, unspecified: Secondary | ICD-10-CM | POA: Insufficient documentation

## 2022-03-19 DIAGNOSIS — I25119 Atherosclerotic heart disease of native coronary artery with unspecified angina pectoris: Secondary | ICD-10-CM | POA: Insufficient documentation

## 2022-03-19 DIAGNOSIS — I495 Sick sinus syndrome: Secondary | ICD-10-CM | POA: Insufficient documentation

## 2022-03-19 DIAGNOSIS — F419 Anxiety disorder, unspecified: Secondary | ICD-10-CM | POA: Insufficient documentation

## 2022-03-19 DIAGNOSIS — K219 Gastro-esophageal reflux disease without esophagitis: Secondary | ICD-10-CM | POA: Diagnosis not present

## 2022-03-19 HISTORY — PX: CATARACT EXTRACTION W/PHACO: SHX586

## 2022-03-19 HISTORY — DX: Presence of cardiac pacemaker: Z95.0

## 2022-03-19 SURGERY — PHACOEMULSIFICATION, CATARACT, WITH IOL INSERTION
Anesthesia: Monitor Anesthesia Care | Site: Eye | Laterality: Right

## 2022-03-19 MED ORDER — ONDANSETRON HCL 4 MG/2ML IJ SOLN: 4.0000 mg | Freq: Once | INTRAMUSCULAR | Status: DC | PRN

## 2022-03-19 MED ORDER — TETRACAINE HCL 0.5 % OP SOLN
1.0000 [drp] | OPHTHALMIC | Status: DC | PRN
  Administered 2022-03-19 (×3): 1 [drp] via OPHTHALMIC

## 2022-03-19 MED ORDER — LIDOCAINE HCL (PF) 2 % IJ SOLN
INTRAOCULAR | Status: DC | PRN
  Administered 2022-03-19: 1 mL via INTRAOCULAR

## 2022-03-19 MED ORDER — MOXIFLOXACIN HCL 0.5 % OP SOLN
OPHTHALMIC | Status: DC | PRN
  Administered 2022-03-19: 0.2 mL via OPHTHALMIC

## 2022-03-19 MED ORDER — ARMC OPHTHALMIC DILATING DROPS
1.0000 "application " | OPHTHALMIC | Status: DC | PRN
  Administered 2022-03-19 (×3): 1 via OPHTHALMIC

## 2022-03-19 MED ORDER — BRIMONIDINE TARTRATE-TIMOLOL 0.2-0.5 % OP SOLN
OPHTHALMIC | Status: DC | PRN
Start: 2022-03-19 — End: 2022-03-19
  Administered 2022-03-19: 1 [drp] via OPHTHALMIC

## 2022-03-19 MED ORDER — ACETAMINOPHEN 325 MG PO TABS: 325.0000 mg | ORAL_TABLET | ORAL | Status: DC | PRN

## 2022-03-19 MED ORDER — MIDAZOLAM HCL 2 MG/2ML IJ SOLN
INTRAMUSCULAR | Status: DC | PRN
  Administered 2022-03-19: .5 mg via INTRAVENOUS

## 2022-03-19 MED ORDER — SIGHTPATH DOSE#1 NA HYALUR & NA CHOND-NA HYALUR IO KIT
PACK | INTRAOCULAR | Status: DC | PRN
  Administered 2022-03-19: 1 via OPHTHALMIC

## 2022-03-19 MED ORDER — FENTANYL CITRATE (PF) 100 MCG/2ML IJ SOLN
INTRAMUSCULAR | Status: DC | PRN
  Administered 2022-03-19: 50 ug via INTRAVENOUS

## 2022-03-19 MED ORDER — ACETAMINOPHEN 160 MG/5ML PO SOLN: 325.0000 mg | ORAL | Status: DC | PRN

## 2022-03-19 MED ORDER — SIGHTPATH DOSE#1 BSS IO SOLN
INTRAOCULAR | Status: DC | PRN
  Administered 2022-03-19: 137 mL via OPHTHALMIC

## 2022-03-19 MED ORDER — SIGHTPATH DOSE#1 BSS IO SOLN
INTRAOCULAR | Status: DC | PRN
  Administered 2022-03-19: 15 mL

## 2022-03-19 SURGICAL SUPPLY — 15 items
CATARACT SUITE SIGHTPATH (MISCELLANEOUS) ×2 IMPLANT
DISSECTOR HYDRO NUCLEUS 50X22 (MISCELLANEOUS) ×2 IMPLANT
DRSG TEGADERM 2-3/8X2-3/4 SM (GAUZE/BANDAGES/DRESSINGS) ×2 IMPLANT
FEE CATARACT SUITE SIGHTPATH (MISCELLANEOUS) ×1 IMPLANT
GLOVE SURG GAMMEX PI TX LF 7.5 (GLOVE) ×2 IMPLANT
GLOVE SURG SYN 8.5  E (GLOVE) ×1
GLOVE SURG SYN 8.5 E (GLOVE) ×1 IMPLANT
GLOVE SURG SYN 8.5 PF PI (GLOVE) ×1 IMPLANT
LENS IOL TECNIS EYHANCE 21.5 (Intraocular Lens) ×1 IMPLANT
NDL FILTER BLUNT 18X1 1/2 (NEEDLE) ×1 IMPLANT
NEEDLE FILTER BLUNT 18X 1/2SAF (NEEDLE) ×1
NEEDLE FILTER BLUNT 18X1 1/2 (NEEDLE) ×1 IMPLANT
SYR 3ML LL SCALE MARK (SYRINGE) ×2 IMPLANT
SYR 5ML LL (SYRINGE) ×2 IMPLANT
WATER STERILE IRR 250ML POUR (IV SOLUTION) ×2 IMPLANT

## 2022-03-19 NOTE — H&P (Signed)
[place]  ? ?Primary Care Physician:  Valera Castle, MD ?Ophthalmologist: Dr. Merleen Nicely ? ?Pre-Procedure History & Physical: ?HPI:  [name] is a 88 y.o. male here for cataract surgery. ?  ?Past Medical History:  ?Diagnosis Date  ? Aneurysm ([place])   ? Arthritis   ? Chronic a-fib ([place]) 03/18/2016  ? Overview:  Sp LAA closure device 2017  ? Coronary artery disease   ? GERD (gastroesophageal reflux disease)   ? History of kidney stones   ? HLD (hyperlipidemia)   ? Hypertension   ? Presence of permanent cardiac pacemaker   ? Sick sinus syndrome ([place]) 07/26/2018  ? Sp single chamber pacer 2019  ? ? ?Past Surgical History:  ?Procedure Laterality Date  ? APPENDECTOMY    ? CARDIAC CATHETERIZATION    ? CARDIAC SURGERY    ? CATARACT EXTRACTION W/PHACO Left 02/26/2022  ? Procedure: CATARACT EXTRACTION PHACO AND INTRAOCULAR LENS PLACEMENT ([place]) LEFT;  Surgeon: Norvel Richards, MD;  Location: [facility];  Service: Ophthalmology;  Laterality: Left;  ? CHOLECYSTECTOMY    ? CORONARY ANGIOPLASTY    ? CORONARY ARTERY BYPASS GRAFT  1995  ? 4 vessel.  [place], [place]  ? MASTOIDECTOMY    ? PACEMAKER INSERTION Left 07/26/2018  ? Procedure: INSERTION PACEMAKER-SINGLE CHAMBER INITIAL INSERT;  Surgeon: Isaias Cowman, MD;  Location: [facility] OR[facility];  Service: Cardiovascular;  Laterality: Left;  ? RIGHT HEART CATH AND CORONARY/GRAFT ANGIOGRAPHY N/A 06/01/2019  ? Procedure: RIGHT HEART CATH AND CORONARY/GRAFT ANGIOGRAPHY;  Surgeon: Corey Skains, MD;  Location: [facility] [facility];  Service: Cardiovascular;  Laterality: N/A;  ? TONSILLECTOMY    ? ? ?Prior to Admission medications   ?Medication Sig Start Date End Date Taking? Authorizing Provider  ?acetaminophen (TYLENOL) 500 MG tablet Take 500 mg by mouth every 6 (six) hours as needed (for pain.).   Yes [provider]  ?amLODipine (NORVASC) 5 MG tablet Take 5 mg by mouth daily.   Yes [provider]  ?aspirin EC 81 MG tablet  Take 81 mg by mouth daily.   Yes [provider]  ?atorvastatin (LIPITOR) 20 MG tablet Take 20 mg by mouth daily. 06/02/19  Yes [provider]  ?famotidine (PEPCID) 20 MG tablet Take 20 mg by mouth 2 (two) times daily.   Yes [provider]  ?FLUoxetine (PROZAC) 20 MG capsule Take 20 mg by mouth daily. 01/06/21  Yes [provider]  ?furosemide (LASIX) 20 MG tablet Take 20 mg by mouth daily. 02/13/21  Yes [provider]  ?isosorbide mononitrate (IMDUR) 30 MG 24 hr tablet Take 30 mg by mouth daily. 01/06/21  Yes [provider]  ?losartan (COZAAR) 50 MG tablet Take 50 mg by mouth daily. 01/06/21  Yes [provider]  ?metoprolol succinate (TOPROL-XL) 25 MG 24 hr tablet Take 25 mg by mouth daily.   Yes [provider]  ?Multiple Vitamins-Minerals (CENTRUM SILVER PO) Take by mouth daily.   Yes [provider]  ? ? ?Allergies as of 02/11/2022 - Review Complete 10/20/2021  ?Allergen Reaction Noted  ? Crab [shellfish allergy] Swelling 07/08/2018  ? Penicillins Itching, Swelling, and Other (See Comments) 12/10/2015  ? ? ?Family History  ?Problem Relation Age of Onset  ? Cancer Mother   ? Heart disease Mother   ? Heart attack Father   ? Heart disease Father   ? ? ?Social History  ? ?Socioeconomic History  ? Marital status: Married  ?  Spouse name:  Not on file  ? Number of children: Not on file  ? Years of education: Not on file  ? Highest education level: Not on file  ?Occupational History  ? Not on file  ?Tobacco Use  ? Smoking status: Former  ?  Types: Cigarettes  ?  Quit date: 68  ?  Years since quitting: 51.3  ? Smokeless tobacco: Never  ?Vaping Use  ? Vaping Use: Never used  ?Substance and Sexual Activity  ? Alcohol use: Yes  ?  Comment: wine occas.   ? Drug use: No  ? Sexual activity: Not on file  ?Other Topics Concern  ? Not on file  ?Social History Narrative  ? Not on file  ? ?Social Determinants of Health  ? ?Financial Resource Strain:  Not on file  ?Food Insecurity: Not on file  ?Transportation Needs: Not on file  ?Physical Activity: Not on file  ?Stress: Not on file  ?Social Connections: Not on file  ?Intimate Partner Violence: Not on file  ? ? ?Review of Systems: ?See HPI, otherwise negative ROS ? ?Physical Exam: ?BP 131/73   Pulse 66   Temp (!) 97.2 ?F (36.2 ?C) (Temporal)   Wt 83.9 kg   SpO2 98%   BMI 24.41 kg/m?  ?General:   Alert, cooperative in NAD ?Head:  Normocephalic and atraumatic. ?Respiratory:  Normal work of breathing. ?Cardiovascular:  RRR ? ?Impression/Plan: ?[name] is here for cataract surgery. ? ?Risks, benefits, limitations, and alternatives regarding cataract surgery have been reviewed with the patient.  Questions have been answered.  All parties agreeable. ? ? ?Norvel Richards, MD  03/19/2022, 12:04 PM ? ?

## 2022-03-19 NOTE — Anesthesia Procedure Notes (Signed)
Procedure Name: [place] ?Date/Time: 03/19/2022 12:11 PM ?Performed by: Cameron Ali, CRNA ?Pre-anesthesia Checklist: Patient identified, Emergency Drugs available, Suction available, Timeout performed and Patient being monitored ?Patient Re-evaluated:Patient Re-evaluated prior to induction ?Oxygen Delivery Method: Nasal cannula ?Placement Confirmation: positive ETCO2 ? ? ? ? ?

## 2022-03-19 NOTE — Anesthesia Postprocedure Evaluation (Signed)
Anesthesia Post Note ? ?Patient: [name] ? ?Procedure(s) Performed: CATARACT EXTRACTION PHACO AND INTRAOCULAR LENS PLACEMENT (IOC) RIGHT 13.38 01:44.5 (Right: Eye) ? ? ?  ?Patient location during evaluation: PACU ?Anesthesia Type: MAC ?Level of consciousness: awake ?Pain management: pain level controlled ?Vital Signs Assessment: post-procedure vital signs reviewed and stable ?Respiratory status: respiratory function stable ?Cardiovascular status: stable ?Postop Assessment: no apparent nausea or vomiting ?Anesthetic complications: no ? ? ?No notable events documented. ? ?Veda Canning ? ? ? ? ? ?

## 2022-03-19 NOTE — Transfer of Care (Signed)
Immediate Anesthesia Transfer of Care Note ? ?Patient: [name] ? ?Procedure(s) Performed: CATARACT EXTRACTION PHACO AND INTRAOCULAR LENS PLACEMENT (IOC) RIGHT 13.38 01:44.5 (Right: Eye) ? ?Patient Location: PACU ? ?Anesthesia Type: MAC ? ?Level of Consciousness: awake, alert  and patient cooperative ? ?Airway and Oxygen Therapy: Patient Spontanous Breathing and Patient connected to supplemental oxygen ? ?Post-op Assessment: Post-op Vital signs reviewed, Patient's Cardiovascular Status Stable, Respiratory Function Stable, Patent Airway and No signs of Nausea or vomiting ? ?Post-op Vital Signs: Reviewed and stable ? ?Complications: No notable events documented. ? ?

## 2022-03-19 NOTE — Anesthesia Preprocedure Evaluation (Signed)
Anesthesia Evaluation  ?Patient identified by MRN, date of birth, ID band ?Patient awake ? ? ? ?Reviewed: ?Allergy & Precautions, NPO status  ? ?Airway ?Mallampati: II ? ?TM Distance: >3 FB ? ? ? ? Dental ?  ?Pulmonary ?former smoker,  ?  ?Pulmonary exam normal ? ? ? ? ? ? ? Cardiovascular ?hypertension, + angina + CAD and +CHF  ?+ dysrhythmias (sick sinus syndrome) + pacemaker  ?Rhythm:Regular Rate:Normal ? ?HLD ?  ?Neuro/Psych ?PSYCHIATRIC DISORDERS Anxiety Depression   ? GI/Hepatic ?GERD  ,  ?Endo/Other  ? ? Renal/GU ?  ? ?  ?Musculoskeletal ? ?(+) Arthritis ,  ? Abdominal ?  ?Peds ? Hematology ?  ?Anesthesia Other Findings ? ? Reproductive/Obstetrics ? ?  ? ? ? ? ? ? ? ? ? ? ? ? ? ?  ?  ? ? ? ? ? ? ? ? ?Anesthesia Physical ? ?Anesthesia Plan ? ?ASA: 4 ? ?Anesthesia Plan: MAC  ? ?Post-op Pain Management: Minimal or no pain anticipated  ? ?Induction: Intravenous ? ?PONV Risk Score and Plan: 1 and TIVA, Midazolam and Treatment may vary due to age or medical condition ? ?Airway Management Planned: Natural Airway and Nasal Cannula ? ?Additional Equipment:  ? ?Intra-op Plan:  ? ?Post-operative Plan:  ? ?Informed Consent: I have reviewed the patients History and Physical, chart, labs and discussed the procedure including the risks, benefits and alternatives for the proposed anesthesia with the patient or authorized representative who has indicated his/her understanding and acceptance.  ? ? ? ? ? ?Plan Discussed with: CRNA ? ?Anesthesia Plan Comments:   ? ? ? ? ? ? ?Anesthesia Quick Evaluation ? ?

## 2022-03-19 NOTE — Op Note (Signed)
OPERATIVE NOTE ? ?JERMARI TAMARGO ?[id number] ?03/19/2022 ? ? ?PREOPERATIVE DIAGNOSIS: Nuclear sclerotic cataract right eye. H25.11 ?  ?POSTOPERATIVE DIAGNOSIS: Nuclear sclerotic cataract right eye. H25.11 ?  ?PROCEDURE:  Phacoemusification with posterior chamber intraocular lens placement of the right eye  ?Ultrasound time: Procedure(s): ?CATARACT EXTRACTION PHACO AND INTRAOCULAR LENS PLACEMENT (IOC) RIGHT 13.38 01:44.5 (Right) ? ?LENS:   ?Implant Name Type Inv. Item Serial No. Manufacturer Lot No. LRB No. Used Action  ?LENS IOL TECNIS EYHANCE 21.5 - [id number] Intraocular Lens LENS IOL TECNIS EYHANCE 21.5 [id number] [employer]  Right 1 Implanted  ?   ? ?SURGEON:  Courtney Heys. Lazarus Salines, MD ?  ?ANESTHESIA:  Topical with tetracaine drops, augmented with 1% preservative-free intracameral lidocaine. ?  ?COMPLICATIONS:  None. ?  ?DESCRIPTION OF PROCEDURE:  The patient was identified in the holding room and transported to the operating room and placed in the supine position under the operating microscope.  The right eye was identified as the operative eye, which was prepped and draped in the usual sterile ophthalmic fashion. ?  ?A 1 millimeter clear-corneal paracentesis was made superotemporally. Preservative-free 1% lidocaine mixed with 1:1,000 bisulfite-free aqueous solution of epinephrine was injected into the anterior chamber. The anterior chamber was then filled with Viscoat viscoelastic. A 2.4 millimeter keratome was used to make a clear-corneal incision inferotemporally. A curvilinear capsulorrhexis was made with a cystotome and capsulorrhexis forceps. Balanced salt solution was used to hydrodissect and hydrodelineate the nucleus. Phacoemulsification was then used to remove the lens nucleus and epinucleus. The remaining cortex was then removed using the irrigation and aspiration handpiece. Provisc was then placed into the capsular bag to distend it for lens placement. A +21.50 D [id number] intraocular lens was then injected into  the capsular bag. The remaining viscoelastic was aspirated. ?  ?Wounds were hydrated with balanced salt solution.  The anterior chamber was inflated to a physiologic pressure with balanced salt solution.  No wound leaks were noted. Vigamox was injected intracamerally.  Timolol and Brimonidine drops were applied to the eye.  The patient was taken to the recovery room in stable condition without complications of anesthesia or surgery. ? ?Norvel Richards ?03/19/2022, 12:46 PM  ?

## 2022-03-20 ENCOUNTER — Encounter: Payer: Self-pay | Admitting: Ophthalmology

## 2022-05-29 ENCOUNTER — Observation Stay
Admission: EM | Admit: 2022-05-29 | Discharge: 2022-05-30 | Disposition: A | Discharge status: Home / Self Care | Payer: Medicare Other | Attending: Hospitalist | Admitting: Hospitalist

## 2022-05-29 ENCOUNTER — Emergency Department: Payer: Medicare Other

## 2022-05-29 ENCOUNTER — Encounter: Payer: Self-pay | Admitting: Emergency Medicine

## 2022-05-29 ENCOUNTER — Other Ambulatory Visit: Payer: Self-pay

## 2022-05-29 DIAGNOSIS — N1330 Unspecified hydronephrosis: Secondary | ICD-10-CM | POA: Diagnosis not present

## 2022-05-29 DIAGNOSIS — D72829 Elevated white blood cell count, unspecified: Secondary | ICD-10-CM | POA: Insufficient documentation

## 2022-05-29 DIAGNOSIS — Z79899 Other long term (current) drug therapy: Secondary | ICD-10-CM | POA: Insufficient documentation

## 2022-05-29 DIAGNOSIS — L8990 Pressure ulcer of unspecified site, unspecified stage: Secondary | ICD-10-CM | POA: Insufficient documentation

## 2022-05-29 DIAGNOSIS — Z950 Presence of cardiac pacemaker: Secondary | ICD-10-CM | POA: Diagnosis not present

## 2022-05-29 DIAGNOSIS — I482 Chronic atrial fibrillation, unspecified: Secondary | ICD-10-CM | POA: Diagnosis not present

## 2022-05-29 DIAGNOSIS — Z951 Presence of aortocoronary bypass graft: Secondary | ICD-10-CM | POA: Insufficient documentation

## 2022-05-29 DIAGNOSIS — K5641 Fecal impaction: Secondary | ICD-10-CM | POA: Diagnosis not present

## 2022-05-29 DIAGNOSIS — K5900 Constipation, unspecified: Secondary | ICD-10-CM | POA: Diagnosis present

## 2022-05-29 DIAGNOSIS — I10 Essential (primary) hypertension: Secondary | ICD-10-CM | POA: Insufficient documentation

## 2022-05-29 DIAGNOSIS — I2510 Atherosclerotic heart disease of native coronary artery without angina pectoris: Secondary | ICD-10-CM | POA: Insufficient documentation

## 2022-05-29 DIAGNOSIS — Z7982 Long term (current) use of aspirin: Secondary | ICD-10-CM | POA: Insufficient documentation

## 2022-05-29 DIAGNOSIS — K5289 Other specified noninfective gastroenteritis and colitis: Secondary | ICD-10-CM | POA: Diagnosis not present

## 2022-05-29 DIAGNOSIS — Z87891 Personal history of nicotine dependence: Secondary | ICD-10-CM | POA: Insufficient documentation

## 2022-05-29 LAB — COMPREHENSIVE METABOLIC PANEL
ALT: 24 U/L (ref 0–44)
AST: 32 U/L (ref 15–41)
Albumin: 3.7 g/dL (ref 3.5–5.0)
Alkaline Phosphatase: 103 U/L (ref 38–126)
Anion gap: 12 (ref 5–15)
BUN: 13 mg/dL (ref 8–23)
CO2: 25 mmol/L (ref 22–32)
Calcium: 9.4 mg/dL (ref 8.9–10.3)
Chloride: 99 mmol/L (ref 98–111)
Creatinine, Ser: 0.97 mg/dL (ref 0.61–1.24)
GFR, Estimated: >60 mL/min (ref >60)
Glucose, Bld: 148 mg/dL — ABNORMAL HIGH (ref 70–99)
Potassium: 3.9 mmol/L (ref 3.5–5.1)
Sodium: 136 mmol/L (ref 135–145)
Total Bilirubin: 1.6 mg/dL — ABNORMAL HIGH (ref 0.3–1.2)
Total Protein: 7.3 g/dL (ref 6.5–8.1)

## 2022-05-29 LAB — CBC
HCT: 37.8 % — ABNORMAL LOW (ref 39.0–52.0)
Hemoglobin: 12.8 g/dL — ABNORMAL LOW (ref 13.0–17.0)
MCH: 31.7 pg (ref 26.0–34.0)
MCHC: 33.9 g/dL (ref 30.0–36.0)
MCV: 93.6 fL (ref 80.0–100.0)
Platelets: 310 10*3/uL (ref 150–400)
RBC: 4.04 MIL/uL — ABNORMAL LOW (ref 4.22–5.81)
RDW: 13.4 % (ref 11.5–15.5)
WBC: 15.1 10*3/uL — ABNORMAL HIGH (ref 4.0–10.5)
nRBC: 0 % (ref 0.0–0.2)

## 2022-05-29 LAB — LIPASE, BLOOD: Lipase: 25 U/L (ref 11–51)

## 2022-05-29 MED ORDER — FAMOTIDINE 20 MG PO TABS
20.0000 mg | ORAL_TABLET | Freq: Two times a day (BID) | ORAL | Status: DC
  Administered 2022-05-29: 20 mg via ORAL
  Filled 2022-05-29: qty 1

## 2022-05-29 MED ORDER — ONDANSETRON 4 MG PO TBDP: 4.0000 mg | ORAL_TABLET | Freq: Three times a day (TID) | ORAL | Status: DC | PRN

## 2022-05-29 MED ORDER — ACETAMINOPHEN 500 MG PO TABS
1000.0000 mg | ORAL_TABLET | Freq: Three times a day (TID) | ORAL | Status: DC | PRN
  Administered 2022-05-29: 1000 mg via ORAL
  Filled 2022-05-29: qty 2

## 2022-05-29 MED ORDER — HYDRALAZINE HCL 20 MG/ML IJ SOLN: 10.0000 mg | Freq: Four times a day (QID) | INTRAMUSCULAR | Status: DC | PRN

## 2022-05-29 MED ORDER — IOHEXOL 350 MG/ML SOLN
75.0000 mL | Freq: Once | INTRAVENOUS | Status: AC | PRN
  Administered 2022-05-29: 75 mL via INTRAVENOUS

## 2022-05-29 MED ORDER — FUROSEMIDE 20 MG PO TABS: 20.0000 mg | ORAL_TABLET | Freq: Every day | ORAL | Status: DC

## 2022-05-29 MED ORDER — AMLODIPINE BESYLATE 5 MG PO TABS: 5.0000 mg | ORAL_TABLET | Freq: Every day | ORAL | Status: DC

## 2022-05-29 MED ORDER — SODIUM CHLORIDE 0.9 % IV SOLN: INTRAVENOUS | Status: DC

## 2022-05-29 MED ORDER — SODIUM CHLORIDE 0.9 % IV BOLUS
500.0000 mL | Freq: Once | INTRAVENOUS | Status: AC
  Administered 2022-05-29: 500 mL via INTRAVENOUS

## 2022-05-29 MED ORDER — FENTANYL CITRATE PF 50 MCG/ML IJ SOSY
25.0000 ug | PREFILLED_SYRINGE | Freq: Once | INTRAMUSCULAR | Status: AC
  Administered 2022-05-29: 25 ug via INTRAVENOUS
  Filled 2022-05-29: qty 1

## 2022-05-29 MED ORDER — PIPERACILLIN-TAZOBACTAM 3.375 G IVPB: 3.3750 g | Freq: Once | INTRAVENOUS | Status: DC

## 2022-05-29 MED ORDER — ONDANSETRON HCL 4 MG/2ML IJ SOLN
4.0000 mg | Freq: Once | INTRAMUSCULAR | Status: AC
  Administered 2022-05-29: 4 mg via INTRAVENOUS
  Filled 2022-05-29: qty 2

## 2022-05-29 MED ORDER — LOSARTAN POTASSIUM 50 MG PO TABS: 50.0000 mg | ORAL_TABLET | Freq: Every day | ORAL | Status: DC

## 2022-05-29 MED ORDER — METOPROLOL SUCCINATE ER 25 MG PO TB24: 25.0000 mg | ORAL_TABLET | Freq: Every day | ORAL | Status: DC

## 2022-05-29 MED ORDER — POLYETHYLENE GLYCOL 3350 17 G PO PACK
34.0000 g | PACK | ORAL | Status: AC
  Administered 2022-05-29 – 2022-05-30 (×4): 34 g via ORAL
  Filled 2022-05-29 (×5): qty 2

## 2022-05-29 MED ORDER — KETOROLAC TROMETHAMINE 15 MG/ML IJ SOLN
15.0000 mg | Freq: Once | INTRAMUSCULAR | Status: AC
  Administered 2022-05-29: 15 mg via INTRAVENOUS
  Filled 2022-05-29: qty 1

## 2022-05-29 MED ORDER — ONDANSETRON HCL 4 MG/2ML IJ SOLN
4.0000 mg | Freq: Four times a day (QID) | INTRAMUSCULAR | Status: DC | PRN
  Administered 2022-05-29: 4 mg via INTRAVENOUS
  Filled 2022-05-29: qty 2

## 2022-05-29 MED ORDER — ISOSORBIDE MONONITRATE ER 30 MG PO TB24: 30.0000 mg | ORAL_TABLET | Freq: Every day | ORAL | Status: DC

## 2022-05-29 MED ORDER — ENOXAPARIN SODIUM 40 MG/0.4ML IJ SOSY
40.0000 mg | PREFILLED_SYRINGE | INTRAMUSCULAR | Status: DC
  Administered 2022-05-29: 40 mg via SUBCUTANEOUS
  Filled 2022-05-29: qty 0.4

## 2022-05-29 NOTE — ED Triage Notes (Signed)
First Nurse Note:  Arrives with wife c/o abdominal pain from constipation.  Patient post op 4 weeks from aneurysm surgery at [facility].  AAOx3.  Skin warm and dry. States he is very uncomfortable.

## 2022-05-29 NOTE — ED Triage Notes (Signed)
Pt to ED via POV with severe lower abdominal pain. Pt had surgery 4 weeks ago for aneurysm repair. Pt has not had a bowel movement x 1 week. Pt wife states that they have tried every thing that they told them to try at home without relief. Pt has some nausea but denies vomiting.

## 2022-05-30 ENCOUNTER — Encounter: Payer: Self-pay | Admitting: Hospitalist

## 2022-05-30 DIAGNOSIS — L8990 Pressure ulcer of unspecified site, unspecified stage: Secondary | ICD-10-CM | POA: Insufficient documentation

## 2022-05-30 DIAGNOSIS — K5641 Fecal impaction: Secondary | ICD-10-CM | POA: Diagnosis not present

## 2022-05-30 LAB — BASIC METABOLIC PANEL
Anion gap: 7 (ref 5–15)
BUN: 13 mg/dL (ref 8–23)
CO2: 26 mmol/L (ref 22–32)
Calcium: 8.7 mg/dL — ABNORMAL LOW (ref 8.9–10.3)
Chloride: 102 mmol/L (ref 98–111)
Creatinine, Ser: 0.74 mg/dL (ref 0.61–1.24)
GFR, Estimated: >60 mL/min (ref >60)
Glucose, Bld: 126 mg/dL — ABNORMAL HIGH (ref 70–99)
Potassium: 3.4 mmol/L — ABNORMAL LOW (ref 3.5–5.1)
Sodium: 135 mmol/L (ref 135–145)

## 2022-05-30 LAB — CBC
HCT: 34.3 % — ABNORMAL LOW (ref 39.0–52.0)
Hemoglobin: 11.9 g/dL — ABNORMAL LOW (ref 13.0–17.0)
MCH: 32 pg (ref 26.0–34.0)
MCHC: 34.7 g/dL (ref 30.0–36.0)
MCV: 92.2 fL (ref 80.0–100.0)
Platelets: 274 10*3/uL (ref 150–400)
RBC: 3.72 MIL/uL — ABNORMAL LOW (ref 4.22–5.81)
RDW: 13.4 % (ref 11.5–15.5)
WBC: 13 10*3/uL — ABNORMAL HIGH (ref 4.0–10.5)
nRBC: 0 % (ref 0.0–0.2)

## 2022-05-30 LAB — MAGNESIUM: Magnesium: 1.7 mg/dL (ref 1.7–2.4)

## 2022-05-30 MED ORDER — POLYETHYLENE GLYCOL 3350 17 G PO PACK: 34.0000 g | PACK | ORAL | Status: DC

## 2022-05-30 MED ORDER — POLYETHYLENE GLYCOL 3350 17 G PO PACK: 17.0000 g | PACK | Freq: Two times a day (BID) | ORAL | 0 refills | Status: AC

## 2022-06-03 LAB — CULTURE, BLOOD (ROUTINE X 2)
Culture: NO GROWTH
Special Requests: ADEQUATE

## 2022-06-05 LAB — CULTURE, BLOOD (ROUTINE X 2): Culture: NO GROWTH

## 2022-07-09 ENCOUNTER — Encounter: Payer: Medicare Other | Attending: Physician Assistant | Admitting: Physician Assistant

## 2022-07-09 DIAGNOSIS — L89152 Pressure ulcer of sacral region, stage 2: Secondary | ICD-10-CM | POA: Diagnosis not present

## 2022-07-09 NOTE — Progress Notes (Addendum)
SEIF, TEICHERT ([id number]) Visit Report for 07/09/2022 Arrival Information Details Patient Name: [name], [name] Date of Service: 07/09/2022 10:00 AM Medical Record Number: [id number] Patient Account Number: [id number] Date of Birth/Sex: 12/17/1932 (88 y.o. M) Treating RN: Levora Dredge Primary Care Jehieli Brassell: Johny Drilling Other Clinician: Referring Sirenity Shew: Johny Drilling Treating Jayma Volpi/Extender: Skipper Cliche in Treatment: 0 Visit Information Patient Arrived: Ambulatory Arrival Time: 10:14 Accompanied By: wife Transfer Assistance: None Patient Identification Verified: Yes Secondary Verification Process Completed: Yes Electronic Signature(s) Signed: 07/09/2022 4:10:56 PM By: Levora Dredge Entered By: Levora Dredge on 07/09/2022 10:16:35 Muradyan, Carman Ching ([id number]) -------------------------------------------------------------------------------- Clinic Level of Care Assessment Details Patient Name: [name] Date of Service: 07/09/2022 10:00 AM Medical Record Number: [id number] Patient Account Number: [id number] Date of Birth/Sex: May 27, 1933 (88 y.o. M) Treating RN: Levora Dredge Primary Care Kincade Granberg: Johny Drilling Other Clinician: Referring Kable Haywood: Johny Drilling Treating Ambermarie Honeyman/Extender: Skipper Cliche in Treatment: 0 Clinic Level of Care Assessment Items TOOL 2 Quantity Score '[]'$  - Use when only an EandM is performed on the INITIAL visit 0 ASSESSMENTS - Nursing Assessment / Reassessment X - General Physical Exam (combine w/ comprehensive assessment (listed just below) when performed on new 1 20 pt. evals) X- 1 25 Comprehensive Assessment (HX, ROS, Risk Assessments, Wounds Hx, etc.) ASSESSMENTS - Wound and Skin Assessment / Reassessment '[]'$  - Simple Wound Assessment / Reassessment - one wound 0 '[]'$  - 0 Complex Wound Assessment / Reassessment - multiple wounds X- 1 10 Dermatologic / Skin Assessment (not related to wound area) ASSESSMENTS - Ostomy and/or  Continence Assessment and Care '[]'$  - Incontinence Assessment and Management 0 '[]'$  - 0 Ostomy Care Assessment and Management (repouching, etc.) PROCESS - Coordination of Care X - Simple Patient / Family Education for ongoing care 1 15 '[]'$  - 0 Complex (extensive) Patient / Family Education for ongoing care X- 1 10 Staff obtains Programmer, systems, Records, Test Results / Process Orders '[]'$  - 0 Staff telephones HHA, Nursing Homes / Clarify orders / etc '[]'$  - 0 Routine Transfer to another Facility (non-emergent condition) '[]'$  - 0 Routine Hospital Admission (non-emergent condition) X- 1 15 New Admissions / Biomedical engineer / Ordering NPWT, Apligraf, etc. '[]'$  - 0 Emergency Hospital Admission (emergent condition) X- 1 10 Simple Discharge Coordination '[]'$  - 0 Complex (extensive) Discharge Coordination PROCESS - Special Needs '[]'$  - Pediatric / Minor Patient Management 0 '[]'$  - 0 Isolation Patient Management '[]'$  - 0 Hearing / Language / Visual special needs '[]'$  - 0 Assessment of Community assistance (transportation, D/C planning, etc.) '[]'$  - 0 Additional assistance / Altered mentation '[]'$  - 0 Support Surface(s) Assessment (bed, cushion, seat, etc.) INTERVENTIONS - Wound Cleansing / Measurement '[]'$  - Wound Imaging (photographs - any number of wounds) 0 '[]'$  - 0 Wound Tracing (instead of photographs) '[]'$  - 0 Simple Wound Measurement - one wound '[]'$  - 0 Complex Wound Measurement - multiple wounds Leavelle, Obaloluwa P. ([id number]) '[]'$  - 0 Simple Wound Cleansing - one wound '[]'$  - 0 Complex Wound Cleansing - multiple wounds INTERVENTIONS - Wound Dressings '[]'$  - Small Wound Dressing one or multiple wounds 0 '[]'$  - 0 Medium Wound Dressing one or multiple wounds '[]'$  - 0 Large Wound Dressing one or multiple wounds '[]'$  - 0 Application of Medications - injection INTERVENTIONS - Miscellaneous '[]'$  - External ear exam 0 '[]'$  - 0 Specimen Collection (cultures, biopsies, blood, body fluids, etc.) '[]'$  - 0 Specimen(s) /  Culture(s) sent or taken to Lab for analysis '[]'$  - 0 Patient Transfer (multiple staff /  Hoyer Lift / Similar devices) '[]'$  - 0 Simple Staple / Suture removal (25 or less) '[]'$  - 0 Complex Staple / Suture removal (26 or more) '[]'$  - 0 Hypo / Hyperglycemic Management (close monitor of Blood Glucose) '[]'$  - 0 Ankle / Brachial Index (ABI) - do not check if billed separately Has the patient been seen at the hospital within the last three years: Yes Total Score: 105 Level Of Care: New/Established - Level 3 Electronic Signature(s) Signed: 07/09/2022 4:10:56 PM By: Levora Dredge Entered By: Levora Dredge on 07/09/2022 10:36:19 Enoch, Carman Ching ([id number]) -------------------------------------------------------------------------------- Encounter Discharge Information Details Patient Name: [name] Date of Service: 07/09/2022 10:00 AM Medical Record Number: [id number] Patient Account Number: [id number] Date of Birth/Sex: 1933/05/23 (88 y.o. M) Treating RN: Levora Dredge Primary Care Ulas Zuercher: Johny Drilling Other Clinician: Referring Sheanna Dail: Johny Drilling Treating Rishabh Rinkenberger/Extender: Skipper Cliche in Treatment: 0 Encounter Discharge Information Items Discharge Condition: Stable Ambulatory Status: Ambulatory Discharge Destination: Home Transportation: Private Auto Accompanied By: wife Schedule Follow-up Appointment: No Clinical Summary of Care: Electronic Signature(s) Signed: 07/09/2022 4:10:56 PM By: Levora Dredge Entered By: Levora Dredge on 07/09/2022 10:37:00 Brallier, Carman Ching ([id number]) -------------------------------------------------------------------------------- Pain Assessment Details Patient Name: [name] Date of Service: 07/09/2022 10:00 AM Medical Record Number: [id number] Patient Account Number: [id number] Date of Birth/Sex: [birth year] (88 y.o. M) Treating RN: Levora Dredge Primary Care Isamar Wellbrock: Johny Drilling Other Clinician: Referring Loni Delbridge: Johny Drilling Treating Calleen Alvis/Extender: Skipper Cliche in Treatment: 0 Active Problems Location of Pain Severity and Description of Pain Patient Has Paino No Site Locations Rate the pain. Current Pain Level: 0 Pain Management and Medication Current Pain Management: Electronic Signature(s) Signed: 07/09/2022 4:10:56 PM By: Levora Dredge Entered By: Levora Dredge on 07/09/2022 10:16:45 Quist, Carman Ching ([id number]) -------------------------------------------------------------------------------- Patient/Caregiver Education Details Patient Name: [name] Date of Service: 07/09/2022 10:00 AM Medical Record Number: [id number] Patient Account Number: [id number] Date of Birth/Gender: 20-Sep-1933 (88 y.o. M) Treating RN: Levora Dredge Primary Care Physician: Johny Drilling Other Clinician: Referring Physician: Johny Drilling Treating Physician/Extender: Skipper Cliche in Treatment: 0 Education Assessment Education Provided To: Patient and Caregiver Education Topics Provided Wound/Skin Impairment: Handouts: Other: consult only Methods: Explain/Verbal Responses: State content correctly Electronic Signature(s) Signed: 07/09/2022 4:10:56 PM By: Levora Dredge Entered By: Levora Dredge on 07/09/2022 10:36:38 Wardle, Carman Ching ([id number]) -------------------------------------------------------------------------------- Vitals Details Patient Name: [name] Date of Service: 07/09/2022 10:00 AM Medical Record Number: [id number] Patient Account Number: [id number] Date of Birth/Sex: May 16, 1933 (88 y.o. M) Treating RN: Levora Dredge Primary Care Edna Rede: Johny Drilling Other Clinician: Referring Burns Timson: Johny Drilling Treating Naphtali Riede/Extender: Skipper Cliche in Treatment: 0 Vital Signs Time Taken: 10:17 Temperature (F): 97.8 Height (in): 74 Pulse (bpm): 87 Source: Stated Respiratory Rate (breaths/min): 18 Weight (lbs): 172 Blood Pressure (mmHg): 152/76 Source:  Stated Reference Range: 80 - 120 mg / dl Body Mass Index (BMI): 22.1 Electronic Signature(s) Signed: 07/09/2022 4:10:56 PM By: Levora Dredge Entered By: Levora Dredge on 07/09/2022 10:19:30

## 2022-07-09 NOTE — Progress Notes (Addendum)
ETHAN, CLAYBURN ([id number]) Visit Report for 07/09/2022 Chief Complaint Document Details Patient Name: [name], [name]. Date of Service: 07/09/2022 10:00 AM Medical Record Number: [id number] Patient Account Number: [id number] Date of Birth/Sex: 1933-05-13 (88 y.o. M) Treating RN: Levora Dredge Primary Care Provider: Johny Drilling Other Clinician: Referring Provider: Johny Drilling Treating Provider/Extender: Skipper Cliche in Treatment: 0 Information Obtained from: Patient Chief Complaint Sacral pressure ulcer Electronic Signature(s) Signed: 07/09/2022 5:37:40 PM By: Worthy Keeler PA-C Entered By: Worthy Keeler on 07/09/2022 17:37:40 [name], [name] ([id number]) -------------------------------------------------------------------------------- HPI Details Patient Name: [name] Date of Service: 07/09/2022 10:00 AM Medical Record Number: [id number] Patient Account Number: [id number] Date of Birth/Sex: 1933-11-27 (88 y.o. M) Treating RN: Levora Dredge Primary Care Provider: Johny Drilling Other Clinician: Referring Provider: Johny Drilling Treating Provider/Extender: Skipper Cliche in Treatment: 0 History of Present Illness HPI Description: 07-09-2022 upon evaluation today patient appears to be doing well in fact he was referred to our office due to a stage III pressure ulcer on the gluteal region. The good news is upon inspection today this appears to be completely healed. I do not see anything open which is great news. He tells me it feels much better and to be honest if he had been referred here he tells me he would not come today. They just wanted to make sure he is seen with family as well. Overall I am extremely pleased to hear that he is doing much better this developed when he was really sick and not able to move around now that he is moving around a lot better it improved dramatically. Electronic Signature(s) Signed: 07/09/2022 5:38:02 PM By: Worthy Keeler PA-C Entered By:  Worthy Keeler on 07/09/2022 17:38:02 [name], [name] ([id number]) -------------------------------------------------------------------------------- Physical Exam Details Patient Name: [name] Date of Service: 07/09/2022 10:00 AM Medical Record Number: [id number] Patient Account Number: [id number] Date of Birth/Sex: 1933-04-19 (88 y.o. M) Treating RN: Levora Dredge Primary Care Provider: Johny Drilling Other Clinician: Referring Provider: Johny Drilling Treating Provider/Extender: Skipper Cliche in Treatment: 0 Constitutional Well-nourished and well-hydrated in no acute distress. Respiratory normal breathing without difficulty. Psychiatric this patient is able to make decisions and demonstrates good insight into disease process. Alert and Oriented x 3. pleasant and cooperative. Notes Upon inspection patient appears to have complete epithelization there is no open wound and overall I think that he is on the right track towards keeping this healed I did have a discussion about appropriate offloading detailed in the plan. Electronic Signature(s) Signed: 07/09/2022 5:38:37 PM By: Worthy Keeler PA-C Entered By: Worthy Keeler on 07/09/2022 17:38:37 [name], [name] ([id number]) -------------------------------------------------------------------------------- Physician Orders Details Patient Name: [name] Date of Service: 07/09/2022 10:00 AM Medical Record Number: [id number] Patient Account Number: [id number] Date of Birth/Sex: Jan 09, 1933 (88 y.o. M) Treating RN: Levora Dredge Primary Care Provider: Johny Drilling Other Clinician: Referring Provider: Johny Drilling Treating Provider/Extender: Skipper Cliche in Treatment: 0 Verbal / Phone Orders: No Diagnosis Coding ICD-10 Coding Code Description L89.152 Pressure ulcer of sacral region, stage 2 Discharge From [facility] Only - No open areas noted, please call if anything happens to open up. Continue to keep  pressure off area. Get up and walk frequently, at least once every hour get up and move around for at least 5 min. Electronic Signature(s) Signed: 07/09/2022 4:10:56 PM By: Levora Dredge Signed: 07/09/2022 6:41:29 PM By: Worthy Keeler PA-C Entered By: Levora Dredge on 07/09/2022 10:35:48  [name], [name] ([id number]) -------------------------------------------------------------------------------- Problem List Details Patient Name: [name], [name]. Date of Service: 07/09/2022 10:00 AM Medical Record Number: [id number] Patient Account Number: [id number] Date of Birth/Sex: March 10, 1933 (88 y.o. M) Treating RN: Levora Dredge Primary Care Provider: Johny Drilling Other Clinician: Referring Provider: Johny Drilling Treating Provider/Extender: Skipper Cliche in Treatment: 0 Active Problems ICD-10 Encounter Code Description Active Date MDM Diagnosis L89.152 Pressure ulcer of sacral region, stage 2 07/09/2022 No Yes Inactive Problems Resolved Problems Electronic Signature(s) Signed: 07/09/2022 10:30:12 AM By: Worthy Keeler PA-C Entered By: Worthy Keeler on 07/09/2022 10:30:12 [name], [name] ([id number]) -------------------------------------------------------------------------------- Progress Note Details Patient Name: [name] Date of Service: 07/09/2022 10:00 AM Medical Record Number: [id number] Patient Account Number: [id number] Date of Birth/Sex: 04/16/1933 (88 y.o. M) Treating RN: Levora Dredge Primary Care Provider: Johny Drilling Other Clinician: Referring Provider: Johny Drilling Treating Provider/Extender: Skipper Cliche in Treatment: 0 Subjective Chief Complaint Information obtained from Patient Sacral pressure ulcer History of Present Illness (HPI) 07-09-2022 upon evaluation today patient appears to be doing well in fact he was referred to our office due to a stage III pressure ulcer on the gluteal region. The good news is upon inspection today this appears to be  completely healed. I do not see anything open which is great news. He tells me it feels much better and to be honest if he had been referred here he tells me he would not come today. They just wanted to make sure he is seen with family as well. Overall I am extremely pleased to hear that he is doing much better this developed when he was really sick and not able to move around now that he is moving around a lot better it improved dramatically. Objective Constitutional Well-nourished and well-hydrated in no acute distress. Vitals Time Taken: 10:17 AM, Height: 74 in, Source: Stated, Weight: 172 lbs, Source: Stated, BMI: 22.1, Temperature: 97.8 F, Pulse: 87 bpm, Respiratory Rate: 18 breaths/min, Blood Pressure: 152/76 mmHg. Respiratory normal breathing without difficulty. Psychiatric this patient is able to make decisions and demonstrates good insight into disease process. Alert and Oriented x 3. pleasant and cooperative. General Notes: Upon inspection patient appears to have complete epithelization there is no open wound and overall I think that he is on the right track towards keeping this healed I did have a discussion about appropriate offloading detailed in the plan. Assessment Active Problems ICD-10 Pressure ulcer of sacral region, stage 2 Plan Discharge From [facility] Services: Consult Only - No open areas noted, please call if anything happens to open up. Continue to keep pressure off area. Get up and walk frequently, at least once every hour get up and move around for at least 5 min. Sanford, Adrian P. ([id number]) 1. Based on what I am seeing currently I did go ahead and recommend for the patient he needs to make sure to appropriately offload His gluteal area. This means getting up at least every hour for at least 5 minutes. He tells me he does not have a problem as he has to get up and use the bathroom pretty much 5 times an hour every day. 2. I did advise that if he has anything that  worsens or reopens he should contact the office and let me know as soon as possible. We will see the patient back for follow-up visit as needed. Electronic Signature(s) Signed: 07/09/2022 5:39:25 PM By: Worthy Keeler PA-C Entered By: Worthy Keeler on 07/09/2022 17:39:25 [name], Adrian Bologna  P. ([id number]) -------------------------------------------------------------------------------- SuperBill Details Patient Name: ROSECarman Sanford Date of Service: 07/09/2022 Medical Record Number: [id number] Patient Account Number: [id number] Date of Birth/Sex: May 30, 1933 (88 y.o. M) Treating RN: Levora Dredge Primary Care Provider: Johny Drilling Other Clinician: Referring Provider: Johny Drilling Treating Provider/Extender: Skipper Cliche in Treatment: 0 Diagnosis Coding ICD-10 Codes Code Description [phone] Pressure ulcer of sacral region, stage 2 Facility Procedures CPT4 Code: [id number] Description: [place] VISIT-LEV 3 EST PT Modifier: Quantity: 1 Physician Procedures CPT4 Code: [id number] Description: WC PHYS LEVEL 3 o NEW PT Modifier: Quantity: 1 CPT4 Code: Description: ICD-10 Diagnosis Description L89.152 Pressure ulcer of sacral region, stage 2 Modifier: Quantity: Electronic Signature(s) Signed: 07/09/2022 5:39:44 PM By: Worthy Keeler PA-C Previous Signature: 07/09/2022 4:10:56 PM Version By: Levora Dredge Entered By: Worthy Keeler on 07/09/2022 17:39:44

## 2022-07-17 ENCOUNTER — Other Ambulatory Visit (INDEPENDENT_AMBULATORY_CARE_PROVIDER_SITE_OTHER): Payer: Medicare Other

## 2022-07-17 ENCOUNTER — Ambulatory Visit (INDEPENDENT_AMBULATORY_CARE_PROVIDER_SITE_OTHER): Payer: Medicare Other | Admitting: Nurse Practitioner
